# Patient Record
Sex: Female | Born: 1950 | Race: Black or African American | Hispanic: No | Marital: Single | Smoking: Current some day smoker
Health system: Southern US, Community
[De-identification: ages and names within clinical notes are randomized; demographics above are authoritative.]

## PROBLEM LIST (undated history)

## (undated) DIAGNOSIS — E559 Vitamin D deficiency, unspecified: Secondary | ICD-10-CM

## (undated) DIAGNOSIS — M069 Rheumatoid arthritis, unspecified: Secondary | ICD-10-CM

## (undated) DIAGNOSIS — T4145XA Adverse effect of unspecified anesthetic, initial encounter: Secondary | ICD-10-CM

## (undated) DIAGNOSIS — Z972 Presence of dental prosthetic device (complete) (partial): Secondary | ICD-10-CM

## (undated) DIAGNOSIS — T8859XA Other complications of anesthesia, initial encounter: Secondary | ICD-10-CM

## (undated) DIAGNOSIS — Z973 Presence of spectacles and contact lenses: Secondary | ICD-10-CM

## (undated) DIAGNOSIS — I10 Essential (primary) hypertension: Secondary | ICD-10-CM

## (undated) DIAGNOSIS — K219 Gastro-esophageal reflux disease without esophagitis: Secondary | ICD-10-CM

## (undated) DIAGNOSIS — M1990 Unspecified osteoarthritis, unspecified site: Secondary | ICD-10-CM

## (undated) HISTORY — PX: EYE SURGERY: SHX253

## (undated) HISTORY — DX: Rheumatoid arthritis, unspecified: M06.9

## (undated) HISTORY — PX: ANKLE ARTHROPLASTY: SUR68

## (undated) HISTORY — PX: SHOULDER ARTHROSCOPY W/ ROTATOR CUFF REPAIR: SHX2400

## (undated) HISTORY — PX: TUBAL LIGATION: SHX77

## (undated) HISTORY — PX: CERVICAL FUSION: SHX112

## (undated) HISTORY — DX: Vitamin D deficiency, unspecified: E55.9

## (undated) HISTORY — PX: WRIST ARTHROPLASTY: SHX1088

## (undated) HISTORY — PX: ULNAR NERVE REPAIR: SHX2594

## (undated) HISTORY — PX: CARPAL TUNNEL RELEASE: SHX101

---

## 1997-11-12 ENCOUNTER — Other Ambulatory Visit: Admission: RE | Admit: 1997-11-12 | Discharge: 1997-11-12 | Payer: Self-pay | Admitting: Internal Medicine

## 1998-04-12 ENCOUNTER — Ambulatory Visit (HOSPITAL_COMMUNITY): Admission: RE | Admit: 1998-04-12 | Discharge: 1998-04-12 | Payer: Self-pay | Admitting: Neurosurgery

## 1998-04-12 ENCOUNTER — Encounter: Payer: Self-pay | Admitting: Neurosurgery

## 1998-05-16 ENCOUNTER — Ambulatory Visit (HOSPITAL_COMMUNITY): Admission: RE | Admit: 1998-05-16 | Discharge: 1998-05-16 | Payer: Self-pay

## 1998-06-20 ENCOUNTER — Ambulatory Visit (HOSPITAL_BASED_OUTPATIENT_CLINIC_OR_DEPARTMENT_OTHER): Admission: RE | Admit: 1998-06-20 | Discharge: 1998-06-20 | Payer: Self-pay | Admitting: Orthopedic Surgery

## 1999-05-14 ENCOUNTER — Ambulatory Visit (HOSPITAL_BASED_OUTPATIENT_CLINIC_OR_DEPARTMENT_OTHER): Admission: RE | Admit: 1999-05-14 | Discharge: 1999-05-14 | Payer: Self-pay | Admitting: Orthopedic Surgery

## 1999-05-22 ENCOUNTER — Encounter: Admission: RE | Admit: 1999-05-22 | Discharge: 1999-08-20 | Payer: Self-pay | Admitting: Orthopedic Surgery

## 1999-10-07 ENCOUNTER — Encounter: Admission: RE | Admit: 1999-10-07 | Discharge: 1999-10-23 | Payer: Self-pay | Admitting: Neurology

## 1999-12-05 ENCOUNTER — Other Ambulatory Visit: Admission: RE | Admit: 1999-12-05 | Discharge: 1999-12-05 | Payer: Self-pay | Admitting: *Deleted

## 2000-01-22 ENCOUNTER — Ambulatory Visit (HOSPITAL_BASED_OUTPATIENT_CLINIC_OR_DEPARTMENT_OTHER): Admission: RE | Admit: 2000-01-22 | Discharge: 2000-01-23 | Payer: Self-pay | Admitting: Orthopedic Surgery

## 2000-05-04 ENCOUNTER — Ambulatory Visit (HOSPITAL_BASED_OUTPATIENT_CLINIC_OR_DEPARTMENT_OTHER): Admission: RE | Admit: 2000-05-04 | Discharge: 2000-05-04 | Payer: Self-pay | Admitting: Orthopedic Surgery

## 2000-11-02 ENCOUNTER — Other Ambulatory Visit: Admission: RE | Admit: 2000-11-02 | Discharge: 2000-11-02 | Payer: Self-pay | Admitting: Family Medicine

## 2001-02-11 ENCOUNTER — Ambulatory Visit (HOSPITAL_COMMUNITY): Admission: RE | Admit: 2001-02-11 | Discharge: 2001-02-11 | Payer: Self-pay | Admitting: Family Medicine

## 2001-02-16 ENCOUNTER — Encounter: Payer: Self-pay | Admitting: Family Medicine

## 2001-02-16 ENCOUNTER — Ambulatory Visit (HOSPITAL_COMMUNITY): Admission: RE | Admit: 2001-02-16 | Discharge: 2001-02-16 | Payer: Self-pay | Admitting: Family Medicine

## 2001-03-09 ENCOUNTER — Encounter: Payer: Self-pay | Admitting: Family Medicine

## 2001-03-09 ENCOUNTER — Ambulatory Visit (HOSPITAL_COMMUNITY): Admission: RE | Admit: 2001-03-09 | Discharge: 2001-03-09 | Payer: Self-pay | Admitting: Family Medicine

## 2001-06-10 ENCOUNTER — Encounter: Payer: Self-pay | Admitting: Family Medicine

## 2001-06-10 ENCOUNTER — Ambulatory Visit (HOSPITAL_COMMUNITY): Admission: RE | Admit: 2001-06-10 | Discharge: 2001-06-10 | Payer: Self-pay | Admitting: Family Medicine

## 2001-06-29 ENCOUNTER — Ambulatory Visit (HOSPITAL_BASED_OUTPATIENT_CLINIC_OR_DEPARTMENT_OTHER): Admission: RE | Admit: 2001-06-29 | Discharge: 2001-06-30 | Payer: Self-pay | Admitting: Orthopedic Surgery

## 2001-07-29 ENCOUNTER — Encounter: Admission: RE | Admit: 2001-07-29 | Discharge: 2001-09-21 | Payer: Self-pay | Admitting: Orthopedic Surgery

## 2001-11-15 ENCOUNTER — Other Ambulatory Visit: Admission: RE | Admit: 2001-11-15 | Discharge: 2001-11-15 | Payer: Self-pay | Admitting: Family Medicine

## 2002-02-07 ENCOUNTER — Encounter: Payer: Self-pay | Admitting: Emergency Medicine

## 2002-02-07 ENCOUNTER — Inpatient Hospital Stay (HOSPITAL_COMMUNITY): Admission: EM | Admit: 2002-02-07 | Discharge: 2002-02-10 | Payer: Self-pay | Admitting: Emergency Medicine

## 2002-02-09 ENCOUNTER — Encounter: Payer: Self-pay | Admitting: *Deleted

## 2002-02-23 ENCOUNTER — Encounter: Admission: RE | Admit: 2002-02-23 | Discharge: 2002-02-23 | Payer: Self-pay | Admitting: Family Medicine

## 2002-03-23 ENCOUNTER — Ambulatory Visit (HOSPITAL_BASED_OUTPATIENT_CLINIC_OR_DEPARTMENT_OTHER): Admission: RE | Admit: 2002-03-23 | Discharge: 2002-03-23 | Payer: Self-pay | Admitting: Orthopedic Surgery

## 2002-03-29 ENCOUNTER — Encounter: Admission: RE | Admit: 2002-03-29 | Discharge: 2002-06-27 | Payer: Self-pay | Admitting: Orthopedic Surgery

## 2003-01-04 ENCOUNTER — Ambulatory Visit (HOSPITAL_COMMUNITY): Admission: RE | Admit: 2003-01-04 | Discharge: 2003-01-04 | Payer: Self-pay | Admitting: Orthopedic Surgery

## 2003-01-04 ENCOUNTER — Ambulatory Visit (HOSPITAL_BASED_OUTPATIENT_CLINIC_OR_DEPARTMENT_OTHER): Admission: RE | Admit: 2003-01-04 | Discharge: 2003-01-04 | Payer: Self-pay | Admitting: Orthopedic Surgery

## 2004-06-04 ENCOUNTER — Encounter (INDEPENDENT_AMBULATORY_CARE_PROVIDER_SITE_OTHER): Payer: Self-pay | Admitting: Specialist

## 2004-06-04 ENCOUNTER — Ambulatory Visit (HOSPITAL_COMMUNITY): Admission: RE | Admit: 2004-06-04 | Discharge: 2004-06-05 | Payer: Self-pay | Admitting: Orthopedic Surgery

## 2004-12-19 ENCOUNTER — Emergency Department (HOSPITAL_COMMUNITY): Admission: EM | Admit: 2004-12-19 | Discharge: 2004-12-20 | Payer: Self-pay | Admitting: Emergency Medicine

## 2005-11-27 ENCOUNTER — Ambulatory Visit (HOSPITAL_BASED_OUTPATIENT_CLINIC_OR_DEPARTMENT_OTHER): Admission: RE | Admit: 2005-11-27 | Discharge: 2005-11-27 | Payer: Self-pay | Admitting: Orthopedic Surgery

## 2006-11-11 ENCOUNTER — Ambulatory Visit: Payer: Self-pay | Admitting: Family Medicine

## 2006-11-11 ENCOUNTER — Encounter (INDEPENDENT_AMBULATORY_CARE_PROVIDER_SITE_OTHER): Payer: Self-pay | Admitting: Nurse Practitioner

## 2006-11-11 LAB — CONVERTED CEMR LAB
ALT: 13 units/L (ref 0–35)
Albumin: 4.1 g/dL (ref 3.5–5.2)
CO2: 21 meq/L (ref 19–32)
Calcium: 9.3 mg/dL (ref 8.4–10.5)
Eosinophils Relative: 0 % (ref 0–5)
Glucose, Bld: 114 mg/dL — ABNORMAL HIGH (ref 70–99)
HCT: 40 % (ref 36.0–46.0)
Hemoglobin: 12.9 g/dL (ref 12.0–15.0)
Lymphocytes Relative: 18 % (ref 12–46)
Lymphs Abs: 2.4 10*3/uL (ref 0.7–3.3)
Monocytes Absolute: 0.4 10*3/uL (ref 0.2–0.7)
Platelets: 445 10*3/uL — ABNORMAL HIGH (ref 150–400)
RBC: 5.37 M/uL — ABNORMAL HIGH (ref 3.87–5.11)
Sodium: 142 meq/L (ref 135–145)
TSH: 0.296 microintl units/mL — ABNORMAL LOW (ref 0.350–5.50)
Total Bilirubin: 0.5 mg/dL (ref 0.3–1.2)
Total Protein: 6.7 g/dL (ref 6.0–8.3)
WBC: 13.9 10*3/uL — ABNORMAL HIGH (ref 4.0–10.5)

## 2006-11-17 ENCOUNTER — Emergency Department (HOSPITAL_COMMUNITY): Admission: EM | Admit: 2006-11-17 | Discharge: 2006-11-18 | Payer: Self-pay | Admitting: Emergency Medicine

## 2006-12-01 ENCOUNTER — Encounter (INDEPENDENT_AMBULATORY_CARE_PROVIDER_SITE_OTHER): Payer: Self-pay | Admitting: Nurse Practitioner

## 2006-12-01 ENCOUNTER — Ambulatory Visit: Payer: Self-pay | Admitting: Internal Medicine

## 2006-12-01 ENCOUNTER — Other Ambulatory Visit: Admission: RE | Admit: 2006-12-01 | Discharge: 2006-12-01 | Payer: Self-pay | Admitting: Family Medicine

## 2006-12-01 LAB — CONVERTED CEMR LAB
Cholesterol: 200 mg/dL (ref 0–200)
HDL: 50 mg/dL (ref >39)
LDL Cholesterol: 118 mg/dL — ABNORMAL HIGH (ref 0–99)
T3 Uptake Ratio: 39.8 % — ABNORMAL HIGH (ref 22.5–37.0)
Total CHOL/HDL Ratio: 4
Triglycerides: 159 mg/dL — ABNORMAL HIGH (ref <150)
VLDL: 32 mg/dL (ref 0–40)

## 2007-02-03 ENCOUNTER — Ambulatory Visit: Payer: Self-pay | Admitting: Internal Medicine

## 2007-02-03 ENCOUNTER — Encounter (INDEPENDENT_AMBULATORY_CARE_PROVIDER_SITE_OTHER): Payer: Self-pay | Admitting: Nurse Practitioner

## 2007-02-03 LAB — CONVERTED CEMR LAB
Free Thyroxine Index: 1.8 (ref 1.0–3.9)
T3 Uptake Ratio: 38.5 % — ABNORMAL HIGH (ref 22.5–37.0)
TSH: 3.17 microintl units/mL (ref 0.350–5.50)

## 2007-11-24 ENCOUNTER — Ambulatory Visit: Payer: Self-pay | Admitting: Family Medicine

## 2007-11-24 LAB — CONVERTED CEMR LAB
BUN: 20 mg/dL (ref 6–23)
CO2: 22 meq/L (ref 19–32)
Chloride: 109 meq/L (ref 96–112)
Creatinine, Ser: 0.82 mg/dL (ref 0.40–1.20)
Glucose, Bld: 79 mg/dL (ref 70–99)
HCT: 41.5 % (ref 36.0–46.0)
Hemoglobin: 13.8 g/dL (ref 12.0–15.0)
Lymphocytes Relative: 32 % (ref 12–46)
Lymphs Abs: 4.8 10*3/uL — ABNORMAL HIGH (ref 0.7–4.0)
Monocytes Absolute: 1 10*3/uL (ref 0.1–1.0)
Monocytes Relative: 6 % (ref 3–12)
Neutro Abs: 8.8 10*3/uL — ABNORMAL HIGH (ref 1.7–7.7)
WBC: 14.9 10*3/uL — ABNORMAL HIGH (ref 4.0–10.5)

## 2007-11-26 ENCOUNTER — Encounter: Admission: RE | Admit: 2007-11-26 | Discharge: 2007-11-26 | Payer: Self-pay

## 2008-01-20 ENCOUNTER — Inpatient Hospital Stay (HOSPITAL_COMMUNITY): Admission: RE | Admit: 2008-01-20 | Discharge: 2008-01-21 | Payer: Self-pay | Admitting: Neurosurgery

## 2008-02-21 ENCOUNTER — Encounter: Admission: RE | Admit: 2008-02-21 | Discharge: 2008-02-21 | Payer: Self-pay | Admitting: Neurosurgery

## 2008-04-03 ENCOUNTER — Encounter: Admission: RE | Admit: 2008-04-03 | Discharge: 2008-04-03 | Payer: Self-pay | Admitting: Neurosurgery

## 2008-05-03 ENCOUNTER — Encounter: Admission: RE | Admit: 2008-05-03 | Discharge: 2008-05-03 | Payer: Self-pay | Admitting: Neurosurgery

## 2008-11-22 ENCOUNTER — Other Ambulatory Visit: Admission: RE | Admit: 2008-11-22 | Discharge: 2008-11-22 | Payer: Self-pay | Admitting: Internal Medicine

## 2008-11-22 ENCOUNTER — Ambulatory Visit: Payer: Self-pay | Admitting: Internal Medicine

## 2008-11-22 ENCOUNTER — Encounter (INDEPENDENT_AMBULATORY_CARE_PROVIDER_SITE_OTHER): Payer: Self-pay | Admitting: Internal Medicine

## 2008-11-22 LAB — CONVERTED CEMR LAB
BUN: 20 mg/dL (ref 6–23)
CO2: 24 meq/L (ref 19–32)
Chloride: 107 meq/L (ref 96–112)
Creatinine, Ser: 0.79 mg/dL (ref 0.40–1.20)
Eosinophils Absolute: 0.2 10*3/uL (ref 0.0–0.7)
Eosinophils Relative: 1 % (ref 0–5)
Free T4: 0.72 ng/dL — ABNORMAL LOW (ref 0.80–1.80)
Glucose, Bld: 63 mg/dL — ABNORMAL LOW (ref 70–99)
HCT: 41.8 % (ref 36.0–46.0)
Hemoglobin: 12.9 g/dL (ref 12.0–15.0)
Lymphocytes Relative: 26 % (ref 12–46)
Lymphs Abs: 4.4 10*3/uL — ABNORMAL HIGH (ref 0.7–4.0)
MCV: 78.3 fL (ref 78.0–100.0)
Monocytes Absolute: 1 10*3/uL (ref 0.1–1.0)
Monocytes Relative: 6 % (ref 3–12)
Platelets: 404 10*3/uL — ABNORMAL HIGH (ref 150–400)
Potassium: 3.9 meq/L (ref 3.5–5.3)
TSH: 0.957 microintl units/mL (ref 0.350–4.500)
Total Bilirubin: 0.4 mg/dL (ref 0.3–1.2)
Total Protein: 6.9 g/dL (ref 6.0–8.3)
WBC: 16.7 10*3/uL — ABNORMAL HIGH (ref 4.0–10.5)

## 2008-11-29 ENCOUNTER — Ambulatory Visit: Payer: Self-pay | Admitting: Internal Medicine

## 2008-12-13 ENCOUNTER — Ambulatory Visit: Payer: Self-pay | Admitting: Internal Medicine

## 2009-01-17 ENCOUNTER — Ambulatory Visit: Payer: Self-pay | Admitting: Internal Medicine

## 2009-01-17 ENCOUNTER — Encounter (INDEPENDENT_AMBULATORY_CARE_PROVIDER_SITE_OTHER): Payer: Self-pay | Admitting: Internal Medicine

## 2009-01-17 LAB — CONVERTED CEMR LAB
Cholesterol: 208 mg/dL — ABNORMAL HIGH (ref 0–200)
HDL: 54 mg/dL (ref >39)
LDL Cholesterol: 117 mg/dL — ABNORMAL HIGH (ref 0–99)
Total CHOL/HDL Ratio: 3.9
Triglycerides: 184 mg/dL — ABNORMAL HIGH (ref <150)
VLDL: 37 mg/dL (ref 0–40)

## 2009-03-26 ENCOUNTER — Ambulatory Visit: Payer: Self-pay | Admitting: Family Medicine

## 2009-03-26 LAB — CONVERTED CEMR LAB
ALT: 13 units/L (ref 0–35)
AST: 20 units/L (ref 0–37)
Albumin: 4.1 g/dL (ref 3.5–5.2)
Cholesterol: 199 mg/dL (ref 0–200)
HDL: 60 mg/dL (ref >39)
Total CHOL/HDL Ratio: 3.3
Total Protein: 6.6 g/dL (ref 6.0–8.3)
Triglycerides: 166 mg/dL — ABNORMAL HIGH (ref <150)

## 2009-09-25 ENCOUNTER — Ambulatory Visit: Payer: Self-pay | Admitting: Internal Medicine

## 2009-10-23 ENCOUNTER — Ambulatory Visit: Payer: Self-pay | Admitting: Internal Medicine

## 2009-10-23 LAB — CONVERTED CEMR LAB
Calcium: 9.1 mg/dL (ref 8.4–10.5)
Glucose, Bld: 80 mg/dL (ref 70–99)
Sodium: 142 meq/L (ref 135–145)

## 2009-11-28 ENCOUNTER — Ambulatory Visit: Payer: Self-pay | Admitting: Internal Medicine

## 2009-11-28 ENCOUNTER — Other Ambulatory Visit: Admission: RE | Admit: 2009-11-28 | Discharge: 2009-11-28 | Payer: Self-pay | Admitting: Internal Medicine

## 2010-03-12 ENCOUNTER — Telehealth (INDEPENDENT_AMBULATORY_CARE_PROVIDER_SITE_OTHER): Payer: Self-pay | Admitting: *Deleted

## 2010-03-13 ENCOUNTER — Ambulatory Visit: Payer: Self-pay

## 2010-03-13 ENCOUNTER — Encounter (HOSPITAL_COMMUNITY): Admission: RE | Admit: 2010-03-13 | Payer: Self-pay | Source: Home / Self Care | Admitting: Internal Medicine

## 2010-03-13 ENCOUNTER — Ambulatory Visit (HOSPITAL_COMMUNITY)
Admission: RE | Admit: 2010-03-13 | Discharge: 2010-03-13 | Payer: Self-pay | Source: Home / Self Care | Attending: Internal Medicine | Admitting: Internal Medicine

## 2010-03-13 ENCOUNTER — Encounter: Payer: Self-pay | Admitting: Cardiology

## 2010-04-14 ENCOUNTER — Ambulatory Visit (HOSPITAL_COMMUNITY)
Admission: RE | Admit: 2010-04-14 | Discharge: 2010-04-14 | Payer: Self-pay | Source: Home / Self Care | Attending: Family Medicine | Admitting: Family Medicine

## 2010-05-08 NOTE — Progress Notes (Signed)
Summary: Dobuatamine ECHO Pre-Procedure  Phone Note Outgoing Call   Call placed by: Milana Na, EMT-P,  March 12, 2010 3:44 PM Summary of Call: Reviewed information on [employer] Information Sheet (see scanned document for further details).  Spoke with patient. Dobutamine ECHO instructions given. Initial call taken by: Milana Na, EMT-P,  March 12, 2010 3:45 PM

## 2010-05-16 ENCOUNTER — Ambulatory Visit
Admission: RE | Admit: 2010-05-16 | Discharge: 2010-05-16 | Disposition: A | Discharge status: Home / Self Care | Payer: Medicare Other | Source: Ambulatory Visit | Attending: *Deleted | Admitting: *Deleted

## 2010-05-16 ENCOUNTER — Other Ambulatory Visit: Payer: Self-pay

## 2010-05-16 ENCOUNTER — Other Ambulatory Visit: Payer: Self-pay | Admitting: *Deleted

## 2010-05-16 DIAGNOSIS — M542 Cervicalgia: Secondary | ICD-10-CM

## 2010-07-29 ENCOUNTER — Ambulatory Visit (HOSPITAL_BASED_OUTPATIENT_CLINIC_OR_DEPARTMENT_OTHER): Payer: Medicare Other | Attending: Family Medicine

## 2010-07-29 DIAGNOSIS — G4730 Sleep apnea, unspecified: Secondary | ICD-10-CM | POA: Insufficient documentation

## 2010-07-29 DIAGNOSIS — G4710 Hypersomnia, unspecified: Secondary | ICD-10-CM | POA: Insufficient documentation

## 2010-08-02 DIAGNOSIS — R0609 Other forms of dyspnea: Secondary | ICD-10-CM

## 2010-08-02 DIAGNOSIS — G4730 Sleep apnea, unspecified: Secondary | ICD-10-CM

## 2010-08-02 DIAGNOSIS — G4733 Obstructive sleep apnea (adult) (pediatric): Secondary | ICD-10-CM

## 2010-08-02 DIAGNOSIS — R0989 Other specified symptoms and signs involving the circulatory and respiratory systems: Secondary | ICD-10-CM

## 2010-08-02 DIAGNOSIS — G4710 Hypersomnia, unspecified: Secondary | ICD-10-CM

## 2010-08-02 NOTE — Procedures (Signed)
[name], Jenna Medina              ACCOUNT NO.:  [id number]  MEDICAL RECORD NO.:  [id number]          PATIENT TYPE:  OUT  LOCATION:  SLEEP CENTER                 FACILITY:  [facility]  PHYSICIAN:  Sherilyn Windhorst D. Maple Hudson, MD, FCCP, FACPDATE OF BIRTH:  November 10, 1950  DATE OF STUDY:  07/29/2010                           NOCTURNAL POLYSOMNOGRAM  REFERRING PHYSICIAN:  AMELIA WILSON  INDICATION FOR STUDY:  Hypersomnia with sleep apnea.  EPWORTH SLEEPINESS SCORE:  7/24, BMI 26.5.  Weight 140 pounds, height 61 inches.  Neck 13.5 inches.  MEDICATIONS:  Home medications are charted and reviewed.  SLEEP ARCHITECTURE:  Total sleep time 410 minutes with sleep efficiency 96.8%.  Stage I was 3.9%, stage II 74.5%, stage III 0.4%, REM 21.2% of total sleep time.  Sleep latency 1.5 minutes, REM latency 102 minutes, awake after sleep onset 12 minutes, arousal index 8.  BEDTIME MEDICATION:  None.  RESPIRATORY DATA:  Apnea-hypopnea index (AHI) 3.8 per hour.  A total of 26 events was scored including 8 obstructive apneas, 5 central apneas, 13 hypopneas.  The events were not positional.  REM/AHI 14.5 per hour. RDI 5.7 per hour.  There were insufficient events to permit initiation of CPAP titration by split-study protocol on this study night.  OXYGEN DATA:  Moderately loud snoring with oxygen desaturation to a nadir of 86% and a mean oxygen saturation through the study of 94.5% on room air.  CARDIAC DATA:  Sinus rhythm with occasional PAC.  MOVEMENT-PARASOMNIA:  A few limb jerks were noted with insignificant effect on sleep.  No bathroom trips.  IMPRESSIONS-RECOMMENDATIONS: 1. Well-sustained sleep. 2. Occasional respiratory events with sleep disturbance, within normal     limits, AHI 3.8 per hour (normal range 0-     5 per hour).  Non positional events with moderately loud snoring     and oxygen desaturation to a nadir of 86% and mean saturation     through the study of 94.5% on room air.     Rosebud Koenen D.  Maple Hudson, MD, [facility], FACP Diplomate, Biomedical engineer of Sleep Medicine Electronically Signed    CDY/MEDQ  D:  08/02/2010 11:08:37  T:  08/02/2010 11:49:21  Job:  [id number]

## 2010-08-05 ENCOUNTER — Other Ambulatory Visit: Payer: Self-pay | Admitting: Pain Medicine

## 2010-08-05 ENCOUNTER — Other Ambulatory Visit: Payer: Self-pay | Admitting: Unknown Physician Specialty

## 2010-08-05 DIAGNOSIS — M542 Cervicalgia: Secondary | ICD-10-CM

## 2010-08-19 NOTE — Op Note (Signed)
Jenna Medina, Jenna Medina              ACCOUNT NO.:  [id number]   MEDICAL RECORD NO.:  [id number]          PATIENT TYPE:  OIB   LOCATION:  3172                         FACILITY:  [facility]   PHYSICIAN:  Donalee Citrin, M.D.        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   PROCEDURE:  Intracervical diskectomy and fusion at C3-4 using a 7-mm  allograft wedge and a 23-mm Venture plate with four 30-mm beveled  screws.   SURGEON:  Donalee Citrin, MD   ASSISTANT:  Tia Alert, MD   ANESTHESIA:  General endotracheal.   HISTORY OF PRESENT ILLNESS:  The patient is a very pleasant 60 year old  female who has had progressive worsening of neck pain, bilateral arm  numbness and tingling with weakness in her hand, and difficulty opening  jaws.  MRI scan showed a large spondylitic spurring disk displacing the  spinal cord and causing severe spinal stenosis at C3-4.  Due to the  patient's progression of clinical symptoms of early myelopathy and MRI  findings, the patient was recommended to cervical diskectomy fusion.  The risks and benefits of the operation were explained to the patient.  She understood and agreed to proceed forth.   The patient was brought to the OR, was induced general anesthesia,  positioned supine, neck flexed with 5 pounds halter traction.  The right  side of her neck was prepped and draped in usual sterile fashion.  Preoperative x-ray localized the appropriate level, so a curvilinear  incision was made just above the midline through the anterior border  superficial layer of the platysma was identified and divided  longitudinally.  The avascular plane between the sternocleidomastoid and  strap muscle was brought down to the prevertebral fascia.  Prevertebral  fascia was then dissected with Kitners.   Intraoperative x-ray confirmed  localization at the appropriate level.  Annulotomy was then made with a  15 blade scalpel mark.  The disk space in the longus colli was reflected  laterally and self-retaining retractors were placed.  Annulotomy was  then extended.  Anterior osteophytes were bitten off the C3 and C4  vertebral bodies with through-and-through Kerrison punch, and a high-  speed drill was used to drill down the disk space.  The disk space was  noted to be markedly spondylotic and collapsed with minimal disk  residual within the disk space itself, so a drill was used drill down  the spondylitic ridge off both endplates down the posterior annulus and  posterior osteophyte complex.  At this point, the operating microscope  was draped and brought into the field.  Under microscopic illumination,  the annulus surface of the posterior annulus and both C3 and C4  endplates were aggressively underbitten with 1- and 2-mm Kerrison punch  decompressing the central canal.  There was a very large spur causing  marked spinal cord compression predominantly on the left side but  bilaterally and severe uncinate hypertrophy.  This was all underbitten  aggressively.  The C4 pedicles were identified.  The C4 nerve roots and  neural foramen were identified and decompressed, and after aggressive  underbiting of both endplates, central canal was decompressed.  Then,  the endplates were scraped with upgoing curette.  A 7-mm graft was  inserted.  One 2-mm deep anterior __________ .  A 23-mm Venture plate  was then placed.  All screws had excellent purchase.  Locking mechanism  was engaged.  Meticulous hemostasis was maintained and the wound was  copiously irrigated and the  wound was closed in layers with 3-0 Vicryl in platysma and running a 4-0  subcuticular.  Benzoin and Steri-Strips were applied.  The patient  returned to the recovery room in stable condition.  At the end of the  case, sponge and  instrument count was correct.           ______________________________  Donalee Citrin, M.D.     GC/MEDQ  D:  01/20/2008  T:  01/20/2008  Job:  [id number]

## 2010-08-22 NOTE — Op Note (Signed)
Jenna Medina, Jenna Medina              ACCOUNT NO.:  [id number]   MEDICAL RECORD NO.:  [id number]          PATIENT TYPE:  OIB   LOCATION:  2550                         FACILITY:  [facility]   PHYSICIAN:  Cindee Salt, M.D.       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   POSTOPERATIVE DIAGNOSIS:  Status post total wrist arthroplasty with  loosening of the prosthesis, distal component.   OPERATION:  Revision of total wrist arthroplasty distal, component.   SURGEON:  Cindee Salt, M.D.   ASSISTANT:  Artist Pais. Mina Marble, M.D.   ANESTHESIA:  General.   HISTORY OF PRESENT ILLNESS:  The patient is a 60 year old female with  history of a uni-total wrist arthroplasty. She had suffered a fall and has  shown loosening of the distal component. She is admitted for revision.   PROCEDURE:  The patient was brought to the operating room where a general  anesthetic was carried out without difficulty. She was prepped using  DuraPrep, supine position, left arm free. The limb was exsanguinated with an  Esmarch bandage, tourniquet placed high on the arm and was inflated to 150  mmHg. The old incision was used, carried down through subcutaneous tissue.  Bleeders were electrocauterized. Dissection carried down to the third,  fourth dorsal compartment.  Tendons were protected.  A T was made distally.  The joint was then opened. Cultures were taken for both aerobic and  anaerobic cultures. Significant synovitis was present with a dark, black  appearance.  This was removed and sent to pathology.  The distal component  was noted be extremely loose. A drill hole was then placed into the  polyethylene, a screw placed, and the polyethylene cap removed without  difficulty. The two retaining screws were removed. The  plate distally was  then removed without difficulty. The volar synovectomy was performed. The  screw holes and peg hole was then cleaned with rongeurs and curets.  Matrix  was then placed after copiously irrigating each of the component holes after  lining these and marking with radiographs to be certain that the new screw  holes would go down the shaft of the little finger and the shaft of the  index finger. This was confirmed prepped.  Grafton was placed into each.  Again, the alignment was confirmed with the image intensifier and the distal  component was then placed and tapped home.  A 30 mm screw was then placed  into the ulnar side of the distal plate and fixed.  X-rays confirmed  positioning.  Position was confirmed prior to placement of the screw with a  guide wire.  The radial screw was then placed into the index.  This was a 35  mm screw. The x-rays confirmed positioning.  The plate was then tapped to  fully seat it and the screws retightened.  Excellent fixation was afforded.  The polyethylene component was then placed. This  was then tapped into  position confirming alignment, AP and lateral x-rays.  The wrist was placed  through a full range motion with a full range able to be obtained.  X-rays  confirmed positioning of the prosthesis proximally and distally. The wound  was copiously irrigated with saline. The capsule was then closed with figure-  of-eight 4-0 Mersilene sutures, the retinaculum repaired with 4-0 Vicryl,  the subcutaneous tissue with 4-0 Vicryl, and skin with interrupted 5-0 nylon  sutures. Sterile compressive dorsal palmar  splint applied.  On deflation of the tourniquet, all fingers immediately  pinked. She was taken to the recovery room for observation in satisfactory  condition.  She will be discharged on Percocet and a Medrol Dosepak.  She  was given vancomycin preoperatively and Solu-Medrol preoperatively.      GK/MEDQ  D:  06/04/2004  T:  06/04/2004   Job:  [id number]   cc:   Cindee Salt, M.D.  [address]  [place]  [place] [address]  Fax: [phone]

## 2010-08-22 NOTE — Op Note (Signed)
[facility]  Patient:    Jenna Medina, Jenna Medina                       MRN: [id number] Proc. Date: 05/04/00 Adm. Date:  [id number] Attending:  Ronne Binning                           Operative Report  PREOPERATIVE DIAGNOSIS:  Failed fusion right wrist, status post total wrist arthroplasty for rheumatoid arthritis.  SURGEON:  Nicki Reaper, M.D.  ASSISTANT:  Joaquin Courts, R.N.  ANESTHESIA:  General.  ANESTHESIOLOGIST:  Bedelia Person, M.D.  HISTORY:  The patient is a 60 year old female with a history of rheumatoid arthritis.  She has undergone a total wrist arthroplasty, which unfortunately cut out.  She underwent fusion of her wrist with iliac crest bone graft, which healed proximally, did not heal distally.  She is admitted now for fixation, fusion, bone grafting to the nonunion site, due to pain.  DESCRIPTION OF PROCEDURE:  The patient was brought to the operating room, where a general anesthetic was carried out without difficulty.  She was prepped and draped using Betadine scrub and solution with the right arm free. The limb was exsanguinated with an Esmarch bandage, the tourniquet placed high in the arm, was inflated to 200 mmHg.  The old incision was used, extended distally, carried down through subcutaneous tissue.  Bleeders were electrocauterized.  The dissection carried down on the radial aspect to the extensor fourth dorsal compartment tendon.  The periosteum opened.  The nonunion site was immediately apparent.  This was removed with rongeurs and curettes.  This was a sizeable defect.  Its position was confirmed with OEC x-rays.  The entire area of nonunion was removed.  The broken pin was identified but found to be buried within the bone, and it was decided not to attempt to retrieve this in that it was not causing any significant problem and would require significant destruction of the graft.  The allograft femoral head was then cut in half.   The defect was measured.  A 0.5 cm wedge-type graft was then selected.  It was curved in nature.  This was cut out of the femoral head and measured again.  The depth was confirmed.  The graft was then shaped.  This was then positioned into the defect, which brought the wrist out another 0.75-1 cm in distance.  An undercut on the metacarpal bases was then filled with further bone graft, cancellous in nature, to support the corticocancellous graft of the femoral head, which was placed.  This was packed in.  X-rays confirmed position.  The wound irrigated.  Two 6-2 K-wires were then passed from the second and third metacarpals through the graft into the distal radius, securely positioning it.  This was done with forcible compression.  X-rays taken in the AP, lateral, and oblique directions revealed good positioning of the pin, good positioning of the graft.  The wound was again irrigated.  The periosteum closed with interrupted 4-0 Vicryl suture. The subcutaneous tissue was closed with interrupted 4-0 Vicryl and the skin with a running 5-0 nylon suture.  A sterile compressive dressing and splint was applied.  This was a dorsal palmar splint.  The patient tolerated the procedure well and was taken to the recovery room for observation in satisfactory condition.  She is discharged home, to return to the [facility] [place] in one week,  on Percocet and Keflex. DD:  05/04/00 TD:  05/04/00 Job: [id number]

## 2010-08-22 NOTE — Discharge Summary (Signed)
Jenna Medina, Jenna Medina                          ACCOUNT NO.:  [id number]   MEDICAL RECORD NO.:  [id number]                   PATIENT TYPE:  INP   LOCATION:  3709                                 FACILITY:  [facility]   PHYSICIAN:  Nani Gasser, M.D.            DATE OF BIRTH:  01/01/1951   DATE OF ADMISSION:  02/07/2002  DATE OF DISCHARGE:  02/10/2002                                 DISCHARGE SUMMARY   DIAGNOSES:  1. Chest pain.  2. Tobacco abuse.  3. Rheumatoid arthritis.   DISCHARGE MEDICATIONS:  1. Prednisone 5 mg two tablets p.o. every day.  2. Ridaura 3 mg one p.o. b.i.d.  3. Forteo 3 ml, inject 3 ml once a day.  4. Aspirin 325 mg one p.o. every day.  5. Naproxen 500 mg one p.o. b.i.d.  6. Protonix 40 mg one p.o. every day.  7. Vicodin 5/500 one q.6h. p.r.n. for pain.  8. Nitroglycerin 0.4 mg one tablet under tongue p.r.n. for chest pain.  Can     repeat q.5 minutes x3 if needed.  The patient is to call 911 after three     tablets.  The patient was given special instructions to call her doctor     or seek medical assistance if she continued to have any chest pain,     shortness of breath, or otherwise ill.   HOSPITAL COURSE:  The patient is a 60 year old African American female with  a three-hour history of sharp, stabbing, intersternal chest pain.  The  patient had an EKG showing normal sinus rhythm with no ST or T-wave changes.  It also had flipped T waves in V1 and V2, and mild bradycardia.  The  patient's chest pain was relieved with nitroglycerin.  She also had risk  factors including tobacco abuse but denied any nausea, vomiting, diarrhea,  shortness of breath, or pleuritic pain.  The patient's cardiac enzymes  showed a bump from troponin of 0.1 to 0.2 with a CK from 92 to 96, and MB of  2.9 to 3.0.   The following concerns were addressed during admission.  1. Atypical chest pain.  The patient would rule out for MI.  She also had an     adenosine Cardiolite study  on the February 09, 2002 showing that the     patient did have mild chest pain resolving with rest.  There was also a     2:1 and 3:1 block with the adenosine, and a few PACs.  Report was called     to nursing showing that the Cardiolite did not show any ischemia, did     show some decreased activity in the distal anterior wall, but most likely     secondary to breast attenuation.  There was no definite infarct or     ischemia seen, and left ventricular ejection fraction was estimated to be     in 81%, with probable left  ventricular hypertrophy.  Thus, the patient     was discharged home to follow up with her primary care physician at     [facility].  2. Rheumatoid arthritis.  The patient did complain of her joints bothering     her during admission.  The patient was given Vicodin and restarted on her     home medications, including Forteo and Ridaura, as well as her prednisone     and naproxen.  3. Osteoporosis.  The patient was continued on her Forteo during admission.                                              Nani Gasser, M.D.   CM/MEDQ  D:  02/09/2002  T:  02/11/2002  Job:  [id number]

## 2010-08-22 NOTE — Op Note (Signed)
NAMECHAMPAGNE, PALETTA                          ACCOUNT NO.:  [id number]   MEDICAL RECORD NO.:  [id number]                   PATIENT TYPE:  AMB   LOCATION:  DSC                                  FACILITY:  [facility]   PHYSICIAN:  Cindee Salt, M.D.                    DATE OF BIRTH:  04-24-1950   DATE OF PROCEDURE:  01/04/2003  DATE OF DISCHARGE:                                 OPERATIVE REPORT   PREOPERATIVE DIAGNOSIS:  Carpal tunnel syndrome, left hand.   POSTOPERATIVE DIAGNOSIS:  Carpal tunnel syndrome, left hand.   OPERATION:  Decompression of left median nerve.   SURGEON:  Cindee Salt, M.D.   ASSISTANTCarolyne Fiscal.   ANESTHESIA:  General.   INDICATIONS FOR PROCEDURE:  The patient is a 60 year old female with a  history of carpal tunnel syndrome and rheumatoid arthritis.  This has not  responded to conservative treatment.  She is admitted for a carpal tunnel  release and possible tenosynovectomy of flexor tendons, left hand.   DESCRIPTION OF PROCEDURE:  The patient was brought to the operating room and  a general anesthetic carried out without difficulty.  She was prepped using  DuraPrep in the supine position with the left arm free.  The limb was  exsanguinated with an Esmarch bandage.  The tourniquet placed high on the  arm was inflated to 250 mmHg.  A longitudinal incision was made in the palm  and carried down through the subcutaneous tissue.  Bleeders were  electrocauterized.  The palmar fascia was split.  The superficial palmar  arch was identified.  The flexor tendon to the ring and the little finger  was identified to the ulnar side of the median nerve.  The carpal  retinaculum was incised with sharp dissection.  A right angle and Sewall  retractor were placed between the skin and forearm fascia.  The fascia was  released beneath the skin for approximately 3 cm.  The canal was explored.  The tenosynovial tissue was not thickened.  It did not have a significant  rheumatoid  appearance to it.  It did not invade the flexor tendons, and as  such it was decided not to proceed with a tenosynovectomy.  The wound was  irrigated.  The skin was then closed with interrupted #5-0 nylon sutures.  A  sterile compressive dressing and splint were applied.  The patient tolerated the procedure well and was taken to the recovery room  for observation, in satisfactory condition.   DISPOSITION:  She is discharged home, to return to the [facility] in one week, on Vicodin and Keflex.                                               Jillyn Hidden  Merlyn Lot, M.D.    Angelique Blonder  D:  01/04/2003  T:  01/04/2003  Job:  [id number]

## 2010-08-22 NOTE — Op Note (Signed)
NAMEELISAVET, Jenna Medina                          ACCOUNT NO.:  [id number]   MEDICAL RECORD NO.:  [id number]                   PATIENT TYPE:  AMB   LOCATION:  DSC                                  FACILITY:  [facility]   PHYSICIAN:  Cindee Salt, M.D.                    DATE OF BIRTH:  26-Jul-1950   DATE OF PROCEDURE:  DATE OF DISCHARGE:                                 OPERATIVE REPORT   PREOPERATIVE DIAGNOSIS:  Tardy ulnar nerve palsy, left arm.   POSTOPERATIVE DIAGNOSIS:  Tardy ulnar nerve palsy, left arm.   OPERATION:  Subcutaneous transposition, left ulnar nerve.   SURGEON:  Dr. Merlyn Lot.   ANESTHESIA:  General.   DATE OF OPERATION:  03/23/02.   HISTORY:  This patient is a 60 year old female with a history of rheumatoid  arthritis.  She has developed an ulnar neuropathy at her left elbow.  EMG  nerve conduction is positive which has not responded to conservative  treatment.   DESCRIPTION OF PROCEDURE:  The patient is brought to the operating room  where a general anesthetic was carried out without difficulty.  She was  prepped and draped using Betadine scrub and solution, left arm free, supine  position.  The limb was exsanguinated with an Esmarch bandage, tourniquet  placed high and the arm was inflated to 250 mmHg.  A straight incision was  made over the medial epicondyle, carried down through subcutaneous tissue  and bleeders were electrocautery.  The medial right cutaneous nerve form was  identified and protected.  The ulnar nerve was identified, this was  released.  The medial intermuscular septum was incised, left attached to the  tip of the epicondyle.  A fasciotomy was performed to the flexor carpi  ulnaris.  The arcade of Struthers was released.  The nerve was then  anteriorly transposed, carrying with it the vascular structures.  Origin of  the flexor carpi ulnaris was then left attached distally.  A sling was  created and sutured into position to the fascia with  figure-of-eight 4-0  Vicryl sutures.  Using both the medial intermuscular septum and the portion  of fascia over flexor carpi ulnaris.  The nerve freely moved, the wound was  irrigated and subcutaneous tissue was closed with interrupted 3-0 Vicryl  sutures.  The skin was closed with subcuticular 4-0 Monocryl suture.  Steri-  Strips were applied.  A sterile compressive dressing, long arm splint was  applied.  The patient tolerated the procedure well and was taken to the  recovery room for observation, in satisfactory condition.  She is discharged  home to return to the [facility] [place] in one week on Vicodin,  Keflex and Medrol Dosepak.  Cindee Salt, M.D.    GK/MEDQ  D:  03/23/2002  T:  03/24/2002  Job:  [id number]

## 2010-08-22 NOTE — Op Note (Signed)
[place]  Patient:    Jenna Medina, Jenna Medina                       MRN: [id number] Proc. Date: 05/14/99 Adm. Date:  [id number] Disc. Date: [id number] Attending:  Ronne Binning CC:         Nicki Reaper, M.D. (2)                           Operative Report  PREOPERATIVE DIAGNOSIS:  Rheumatoid arthritis, status post wrist fusion, status  post total wrist arthroplasty with loosening, right wrist.  POSTOPERATIVE DIAGNOSIS:  Rheumatoid arthritis, status post wrist fusion, status post total wrist arthroplasty with loosening, right wrist.  OPERATION:  Fusion carpometacarpal joint, index and middle finger, right hand.  SURGEON:  Nicki Reaper, M.D.  ASSISTANT:  Joaquin Courts, R.N.  ANESTHESIA:  General.  ANESTHESIOLOGIST:  Janetta Hora. Gelene Mink, M.D.  HISTORY:  The patient is a 60 year old female with a history of rheumatoid arthritis.  She underwent total wrist arthroplasty, however, the prosthesis loosened distally and cut out necessitating fusion.  She has had continued pain at the carpometacarpal joint of the index and middle finger, relieved by injection. She is brought in now for fusion of those two joints.  DESCRIPTION OF PROCEDURE:  The patient was brought to the operating room where  general anesthesia was carried out without difficulty.  She was prepped and draped using Betadine scrub and solution, with the right arm free.  The limb was exsanguinated with an Esmarch bandage.  Tourniquet placed high on the arm was inflated to 250 mmHg.  The incision was made using the old incision, carried down through subcutaneous tissue.  Bleeders were electrocauterized.  The dissection carried down to the extensor insertion of the sensor carpi radialis longus and brevis.  The periosteum was elevated.  The joint was identified.  This was then  removed with a rongeur curet allowing the metacarpal to be pulled up into more dorsiflexion.  Two 4-5 K-wires  were then placed into the metacarpal of the index, metacarpal of the middle.  Bone putty was then placed into the defect created by the resection of the joint and the two K-wires were then passed across into the  capitate securing the metacarpals into position.  These were confirmed on AP and lateral x-rays.  The wound was irrigated.  The periosteum closed with figure-of-eight 4-0 Vicryl.  The subcutaneous tissue closed with interrupted 4-0 Vicryl and the skin with a subcuticular 4-0 Monocryl suture.  Steri-Strips were  applied.  Sterile compressive dressing and splint applied.  The patient tolerated the procedure well and was taken to the recovery room for  observation in satisfactory condition  She is discharged home to return to [facility] [place] in one week, n Vicodin and Keflex. DD:  05/14/99 TD:  05/14/99 Job: [id number]

## 2010-08-22 NOTE — Op Note (Signed)
Jenna Medina, Jenna Medina              ACCOUNT NO.:  [id number]   MEDICAL RECORD NO.:  [id number]          PATIENT TYPE:  AMB   LOCATION:  DSC                          FACILITY:  [facility]   PHYSICIAN:  Cindee Salt, M.D.       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   OPERATION:  Artelon prosthetic replacement [facility] right middle finger.   SURGEON:  Cindee Salt, M.D.   ASSISTANT:  Carolyne Fiscal R.N.   ANESTHESIA:  General.   HISTORY:  The patient is a 60 year old female with a history of rheumatoid  arthritis.  She underwent total joint replacement, however, has had nonunion  or failure of the prosthesis.  This was removed.  A fusion was attempted.  The proximal fusion occurred, the distal fusion did not.  She is admitted  for interposition arthroplasty to the [facility] joint of her right middle finger,  possibly index. In the preoperative area questions were answered.  She is  aware of the potential for failure of the prosthetic replacement, failure of  total relief of pain, but is desirous of proceeding to have this done, the  possibility of infection, injury to artery, nerves, tendons, dystrophy.   The patient's extremity was marked by both the patient and surgeon,  questions encouraged and answered.   PROCEDURE:  The patient was brought to the operating room where a general  anesthetic was carried out without difficulty.  She was prepped using  DuraPrep, supine position, right arm free.  The limb was exsanguinated with  an  Esmarch bandage, tourniquet placed high on the arm was inflated to 250  mmHg.  A straight incision was made over the dorsal aspect where motion  occurred to her wrist, right side through the old incision carried down  through subcutaneous tissue.  Bleeders were electrocauterized.  The  pseudoarthrosis was identified distally.  The joint was opened granulation  tissue, fibrous tissue was removed with a rongeur.  An oscillating saw was  then used to remove the distal aspect of the graft up for the wrist fusion.  An oscillating saw was then used to remove the dorsal cortices of the graft  and of the middle finger metacarpal for placement of a 1.5 mm Artelon  prosthesis.  The prosthesis was soaked.  The index carpometacarpal area did  not appear to have significant articulation, and as such it was decided not  to proceed with a prosthetic replacement there.  The prosthesis was soaked,  the area irrigated.  The prosthesis placed, was found to be long, this was  cut short to match the depth of the carpometacarpal area.  This was then  affixed with two  screws, one 18 and one 16 2 mm screw proximally and  distally.  The wound was again irrigated.  The periosteum capsule closed  with figure-of-eight 4-0 Vicryl sutures.  Subcutaneous tissue with 4-0  Vicryl and skin with subcuticular 3-0 Monocryl sutures.  Steri-Strips were  applied.  Sterile compressive dressing and dorsal palmar splint applied.  The prosthesis was very stable.  X-rays revealed it good position with screw  lengths engaging the  volar cortices.  The patient tolerated the procedure well and was taken to  the recovery room for observation in satisfactory condition.  She will be  discharged home to return to the [facility] [place] in 1 week on  Percocet.           ______________________________  Cindee Salt, M.D.     GK/MEDQ  D:  11/27/2005  T:  11/27/2005  Job:  [id number]

## 2010-08-22 NOTE — Op Note (Signed)
[place]  Patient:    Jenna Medina, FESS Visit Number: [id number] MRN: [id number]          Service Type: DSU Location: [facility] Attending Physician:  Ronne Binning Dictated by:   Nicki Reaper, M.D. Proc. 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  POSTOPERATIVE DIAGNOSIS:  Rheumatoid arthritis with destruction of left wrist.  OPERATION:  Total wrist arthroplasty left wrist.  SURGEON:  Nicki Reaper, M.D.  ASSISTANT:  Artist Pais. Mina Marble, M.D.  ANESTHESIA:  General.  INDICATION:  The patient is a 60 year old female with a history of rheumatoid arthritis.  She has had rib pain not responding to conservative treatment.  DESCRIPTION OF PROCEDURE:  The patient was brought to the operating room where a general anesthetic was carried out without difficulty.  She was prepped and draped using Betadine scrub and solution and the left arm free.  The limb was exsanguinated with an Esmarch bandage.  Tourniquet was placed high on the arm and was inflated to 250 mmHg.  A longitudinal incision was made over the wrist and hand on to the distal forearm using the old incision, carried down through subcutaneous tissue.  Bleeders were electrocauterized.  Neurovascular structures protected.  The extensor retinaculum was released from the radial side, preserved to the ulnar side over all six compartments.  The extensor tendons were then pulled aside.  A large flap was then created on the dorsal capsule leaving it based distally, lifting it from the radius proximally.  This was preserved.  An incision was made in the distal radial ulnar joint.  The distal ulna showed marked erosions.  Loose bodies were removed.  Synovectomy performed and the ulnar head was removed using an oscillating saw.  This was then dissected  free from soft tissue leaving these attached.  The wrist joint was then opened.  The distal radius showed total loss of cartilage.  The Listers tubercle was removed.  An awl was used to make a hole approximately 5 mm volar to the tubercle of Lister.  This was then enlarged and a guide pin placed for the application of the template for the distal radius cut.  The position of the guide pin was confirmed on x-ray.  Found to go down the shaft of the radius.  The guide pin was placed.  A two 4.5 K-wires were then inserted.  A preliminary cut was made in the distal radius.  The guide pin was removed.  Then the volar cut completed removing the distal radius articular surface.  The broach was then used to enlarge this hole for the placement of a medium prosthesis.  The proximal carpal row was removed leaving the distal half of the scaphoid, the distal half of the triquetrium in place.  The guide was then placed for the placement of the guide distally along the shaft of the third metacarpal.  The distal hamate was identified and the guide placed on this.  A drill hole made with a 2.5 mm drill.  It was slightly enlarged to 3.5.  This allowed placement of the template distally.  The guide was placed on the index finger and a drill hole was then made for the placement of the radial screw into the shaft  of the metacarpal base of the index finger.  This was done with a 4.5 drill and 4.5 screw.  Placement was evaluated and under image intensification lying in good position with the stem in the third metacarpal and the screw in the second metacarpal.  The screw to the ulnar side was then placed.  This was 20 mm.  This was placed and confirmed to lie only in the hamate.  The trial prosthesis was placed.  It was found to be slightly tight and a second cut was made on the distal radius. The shaft was then rereamed and a prosthesis placed.  The trial was then inserted.  A standard polyethylene component  was placed on to the trials and found to lie in good position with good flexion and extension radial and ulnar deviation of her wrist.  It was very stable.  The trial was removed.  The ends of the bones were copiously irrigated with a bacitracin-containing saline solution.  A medium sized prosthesis was then inserted proximally and distally and the standard sized cap placed on the polyethylene component distally. Again, the prosthesis was quite stable prior to placement of the prosthesis proximally.  Drill holes were placed into the dorsal distal radius and sutures placed for reattachment of the capsule.  The joint was then irrigated after placement.  X-rays confirmed excellent positioning of the prosthesis.  The capsule was then closed dorsally and sutured with 3-0 Mersilene sutures.  The extensor retinaculum was left partially intact distally and the proximal portion passed beneath the extensor tendon to reinforce the dorsal capsule.  This was sutured into position with a figure-of-eight Mersilene sutures.  Subcutaneous tissue was then closed after irrigation with interrupted 4-0 Vicryl and the skin with interrupted 5-0 nylon suture.  Sterile compressive dressing and long-arm splint applied.  The patient tolerated the procedure well and was taken to the recovery room for observation in satisfactory condition.  She is admitted for overnight stay for pain control and antibiotics. Dictated by:   Nicki Reaper, M.D. Attending Physician:  Ronne Binning DD:  06/29/01 TD:  06/29/01 Job: [id number]

## 2011-01-02 ENCOUNTER — Emergency Department (HOSPITAL_COMMUNITY): Payer: Medicare Other

## 2011-01-02 ENCOUNTER — Emergency Department (HOSPITAL_COMMUNITY)
Admission: EM | Admit: 2011-01-02 | Discharge: 2011-01-02 | Disposition: A | Discharge status: Home / Self Care | Payer: Medicare Other | Attending: Emergency Medicine | Admitting: Emergency Medicine

## 2011-01-02 DIAGNOSIS — S32509A Unspecified fracture of unspecified pubis, initial encounter for closed fracture: Secondary | ICD-10-CM | POA: Insufficient documentation

## 2011-01-02 DIAGNOSIS — W108XXA Fall (on) (from) other stairs and steps, initial encounter: Secondary | ICD-10-CM | POA: Insufficient documentation

## 2011-01-02 DIAGNOSIS — M25559 Pain in unspecified hip: Secondary | ICD-10-CM | POA: Insufficient documentation

## 2011-01-02 DIAGNOSIS — E7800 Pure hypercholesterolemia, unspecified: Secondary | ICD-10-CM | POA: Insufficient documentation

## 2011-01-02 DIAGNOSIS — M129 Arthropathy, unspecified: Secondary | ICD-10-CM | POA: Insufficient documentation

## 2011-01-02 DIAGNOSIS — I10 Essential (primary) hypertension: Secondary | ICD-10-CM | POA: Insufficient documentation

## 2011-01-02 DIAGNOSIS — R937 Abnormal findings on diagnostic imaging of other parts of musculoskeletal system: Secondary | ICD-10-CM | POA: Insufficient documentation

## 2011-01-02 DIAGNOSIS — M069 Rheumatoid arthritis, unspecified: Secondary | ICD-10-CM | POA: Insufficient documentation

## 2011-01-02 DIAGNOSIS — K219 Gastro-esophageal reflux disease without esophagitis: Secondary | ICD-10-CM | POA: Insufficient documentation

## 2011-01-06 LAB — CBC
MCV: 78.7
Platelets: 356
WBC: 15.3 — ABNORMAL HIGH

## 2011-01-06 LAB — BASIC METABOLIC PANEL
BUN: 17
Creatinine, Ser: 0.77
GFR calc non Af Amer: >60

## 2011-01-07 ENCOUNTER — Other Ambulatory Visit: Payer: Self-pay | Admitting: Sports Medicine

## 2011-01-07 DIAGNOSIS — M25552 Pain in left hip: Secondary | ICD-10-CM

## 2011-01-08 ENCOUNTER — Ambulatory Visit
Admission: RE | Admit: 2011-01-08 | Discharge: 2011-01-08 | Disposition: A | Discharge status: Home / Self Care | Payer: Medicare Other | Source: Ambulatory Visit | Attending: Sports Medicine | Admitting: Sports Medicine

## 2011-01-08 DIAGNOSIS — M25552 Pain in left hip: Secondary | ICD-10-CM

## 2011-01-09 ENCOUNTER — Ambulatory Visit
Admission: RE | Admit: 2011-01-09 | Discharge: 2011-01-09 | Disposition: A | Discharge status: Home / Self Care | Payer: Medicare Other | Source: Ambulatory Visit | Attending: Physician Assistant | Admitting: Physician Assistant

## 2011-01-09 ENCOUNTER — Other Ambulatory Visit: Payer: Self-pay | Admitting: Physician Assistant

## 2011-01-09 DIAGNOSIS — R52 Pain, unspecified: Secondary | ICD-10-CM

## 2011-01-27 ENCOUNTER — Ambulatory Visit
Admission: RE | Admit: 2011-01-27 | Discharge: 2011-01-27 | Disposition: A | Discharge status: Home / Self Care | Payer: Medicare Other | Source: Ambulatory Visit | Attending: Pain Medicine | Admitting: Pain Medicine

## 2011-01-27 ENCOUNTER — Other Ambulatory Visit: Payer: Self-pay | Admitting: Pain Medicine

## 2011-01-27 DIAGNOSIS — M25559 Pain in unspecified hip: Secondary | ICD-10-CM

## 2011-01-27 DIAGNOSIS — M549 Dorsalgia, unspecified: Secondary | ICD-10-CM

## 2011-05-04 ENCOUNTER — Ambulatory Visit
Admission: RE | Admit: 2011-05-04 | Discharge: 2011-05-04 | Disposition: A | Discharge status: Home / Self Care | Payer: Medicare Other | Source: Ambulatory Visit | Attending: Sports Medicine | Admitting: Sports Medicine

## 2011-05-04 ENCOUNTER — Other Ambulatory Visit: Payer: Self-pay | Admitting: Sports Medicine

## 2011-05-04 DIAGNOSIS — T148XXA Other injury of unspecified body region, initial encounter: Secondary | ICD-10-CM

## 2011-12-01 ENCOUNTER — Other Ambulatory Visit: Payer: Self-pay | Admitting: Pain Medicine

## 2011-12-01 DIAGNOSIS — M549 Dorsalgia, unspecified: Secondary | ICD-10-CM

## 2011-12-11 ENCOUNTER — Ambulatory Visit
Admission: RE | Admit: 2011-12-11 | Discharge: 2011-12-11 | Disposition: A | Discharge status: Home / Self Care | Payer: Medicare Other | Source: Ambulatory Visit | Attending: Pain Medicine | Admitting: Pain Medicine

## 2011-12-11 DIAGNOSIS — M549 Dorsalgia, unspecified: Secondary | ICD-10-CM

## 2012-11-05 IMAGING — CT CT HIP*L* W/O CM
1 series · 16 of 28 positions shown, 20 images · non-contrast
Comparison: Plain films left hip [DATE]/7117 1617 hours.

CLINICAL DATA: Status post fall 1 day ago.  Left hip pain.
Question fracture.

CT OF THE LEFT HIP WITHOUT CONTRAST
TECHNIQUE: Multidetector CT imaging was performed according to the
standard protocol. Multiplanar CT image reconstructions were also
generated.

[Series 3: pelvis st · axial · 0.42mm/px · z∈[+736,+861]mm · 16 of 28 slices shown, 20 images]
[im 2/28  soft-tissue]
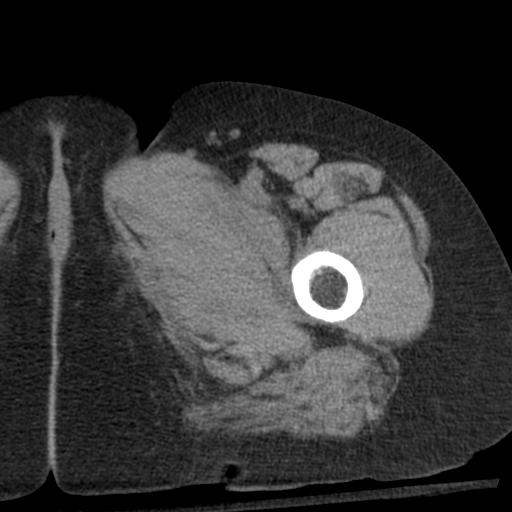
[im 2/28  bone]
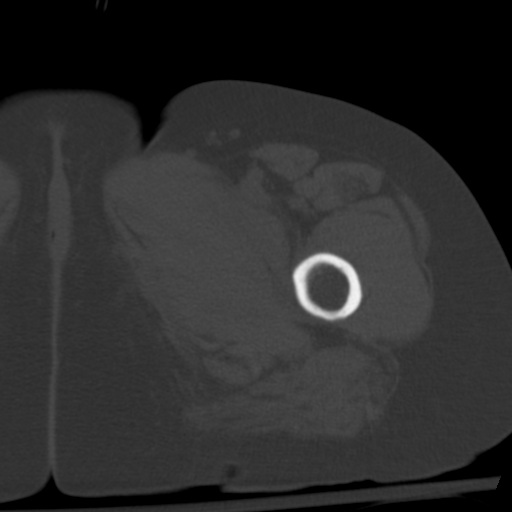
[im 4/28  soft-tissue]
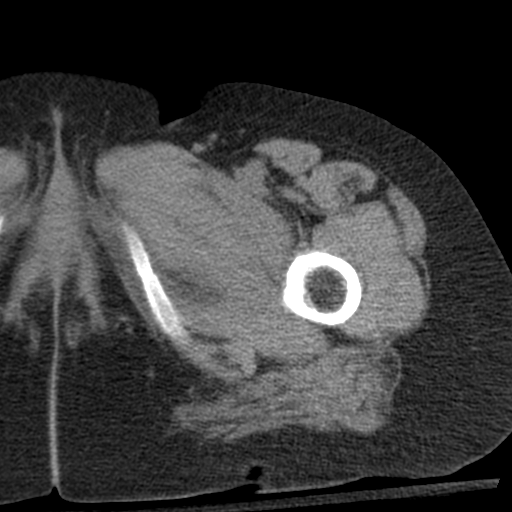
[im 6/28  soft-tissue]
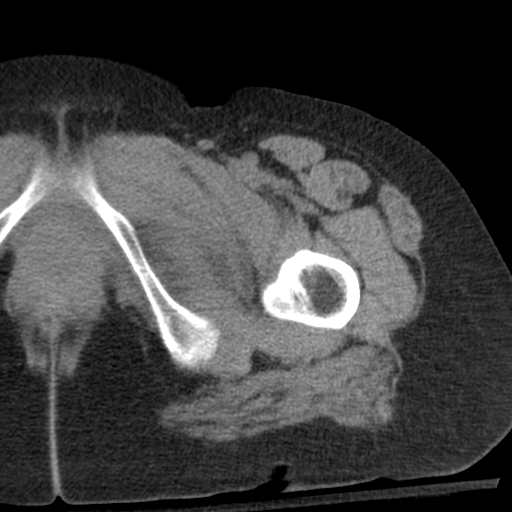
[im 8/28  soft-tissue]
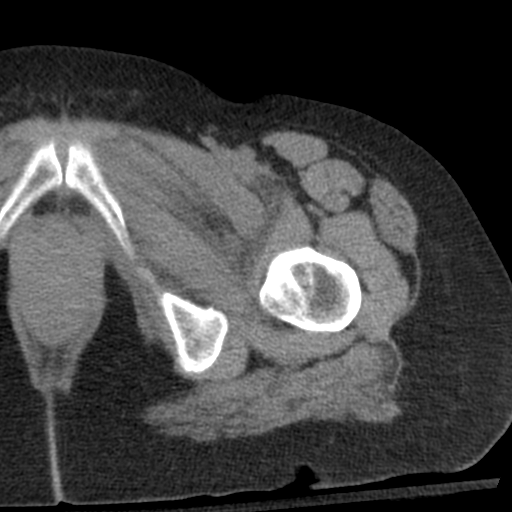
[im 10/28  soft-tissue]
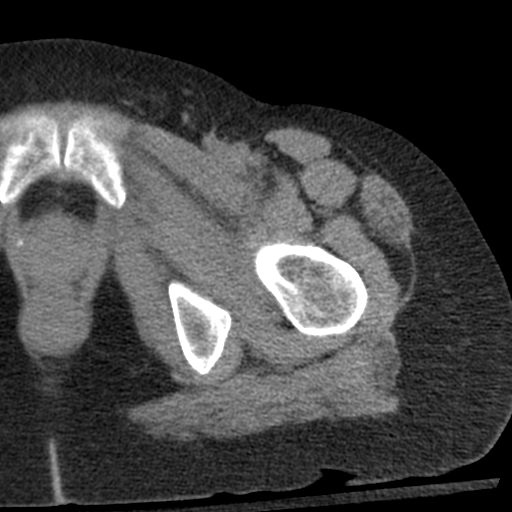
[im 12/28  soft-tissue]
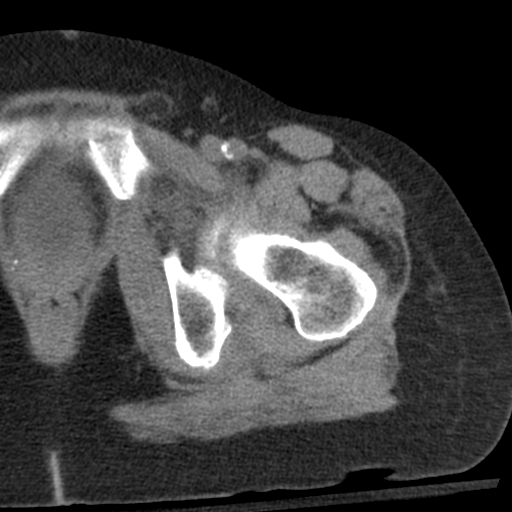
[im 14/28  soft-tissue]
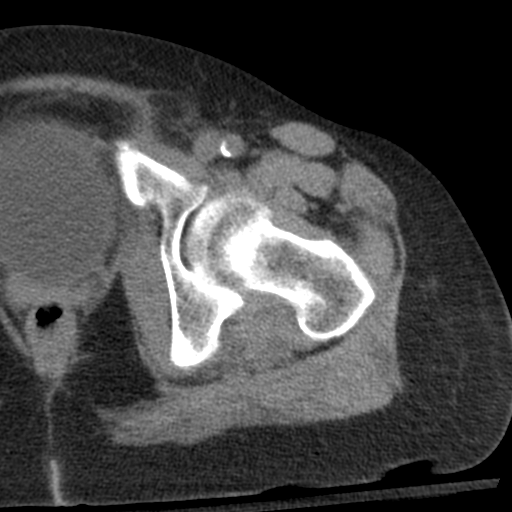
[im 15/28  soft-tissue]
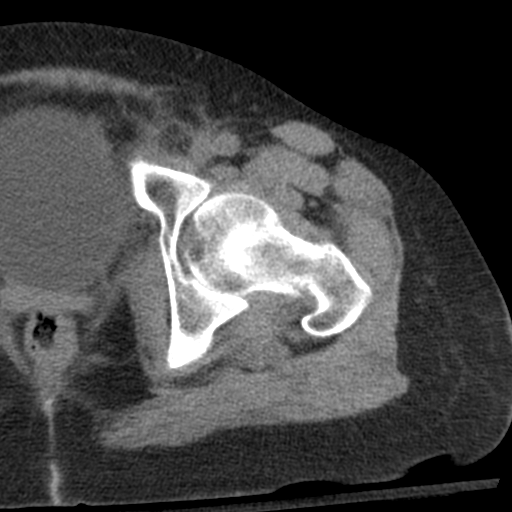
[im 17/28  soft-tissue]
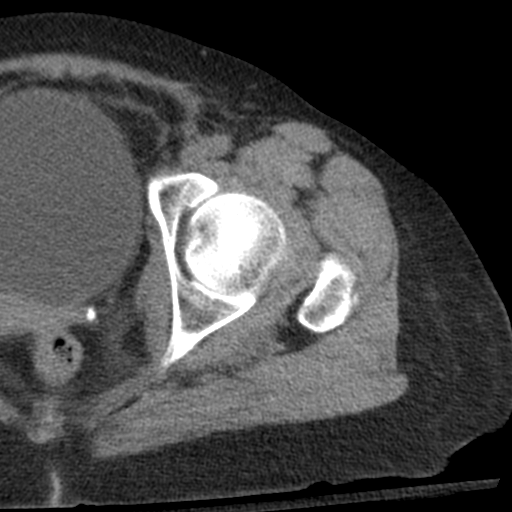
[im 17/28  bone]
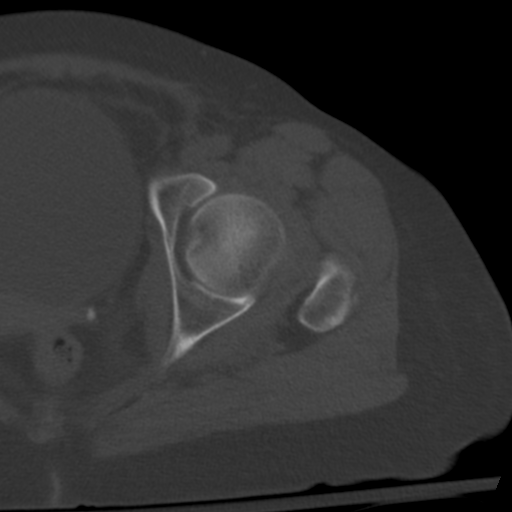
[im 19/28  soft-tissue]
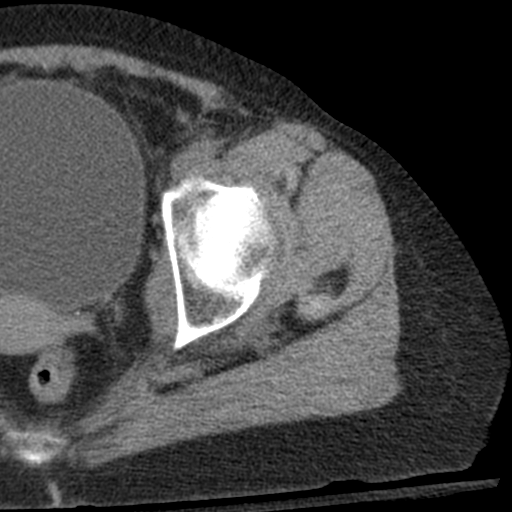
[im 21/28  soft-tissue]
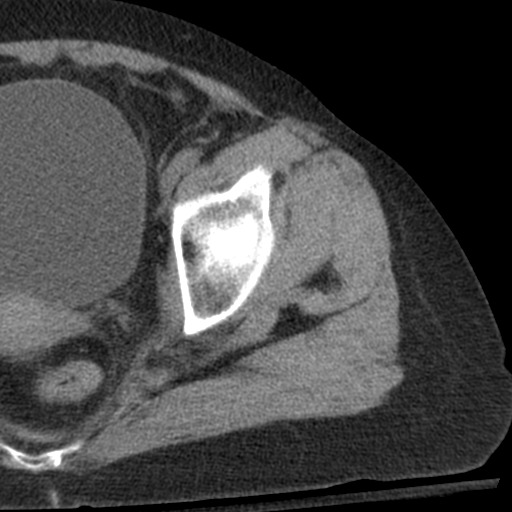
[im 23/28  soft-tissue]
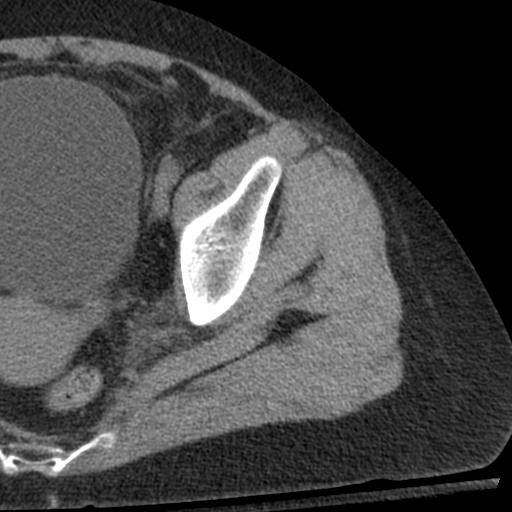
[im 24/28  lung]
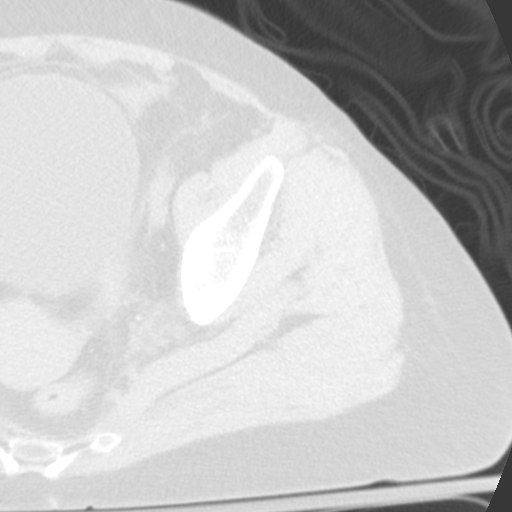
[im 25/28  soft-tissue]
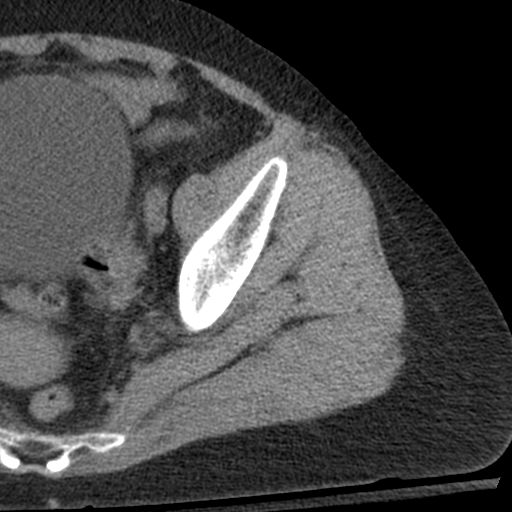
[im 25/28  lung]
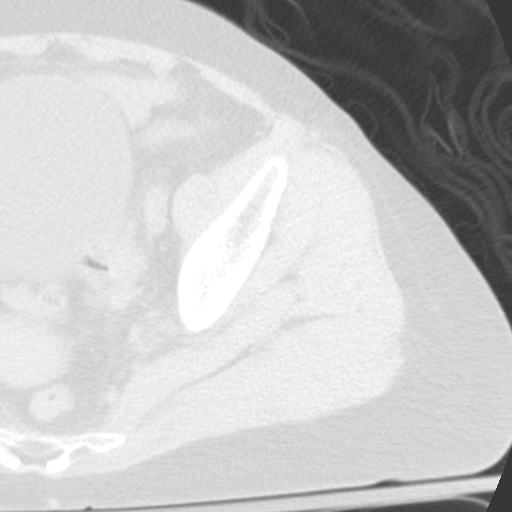
[im 26/28  lung]
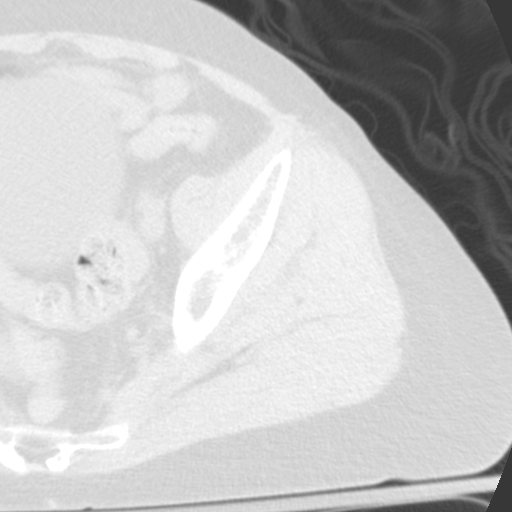
[im 27/28  soft-tissue]
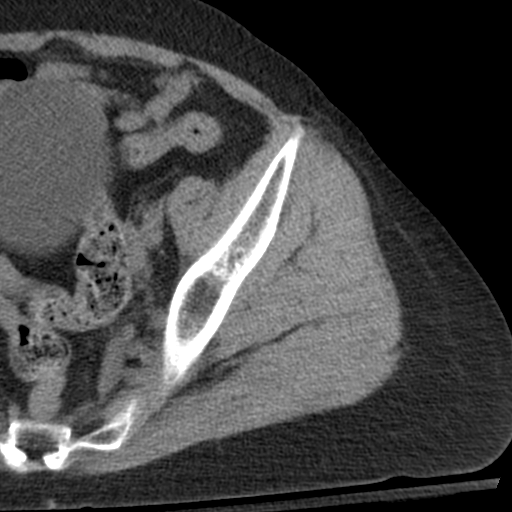
[im 27/28  lung]
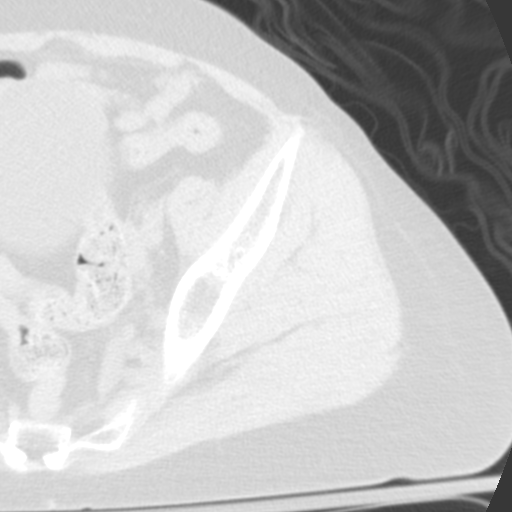

[16 of 28 positions shown; findings below may reference images not displayed]

FINDINGS: The patient has nondisplaced left superior and inferior
pubic rami fractures.  There is a small amount of hematoma about
the patient's fractures.  No hip fracture is identified.  Both hips
are located.  No hip joint effusion is noted.  Imaged intrapelvic
contents are unremarkable.
IMPRESSION: Nondisplaced left superior and inferior pubic rami fractures.
Negative for hip fracture.

## 2012-11-05 IMAGING — CR DG HIP (WITH OR WITHOUT PELVIS) 2-3V*L*
3 series · 3 of 3 positions shown · non-contrast
Comparison: None

CLINICAL DATA: Fell yesterday with left hip pain

LEFT HIP - COMPLETE 2+ VIEW

[t pelvis ap]
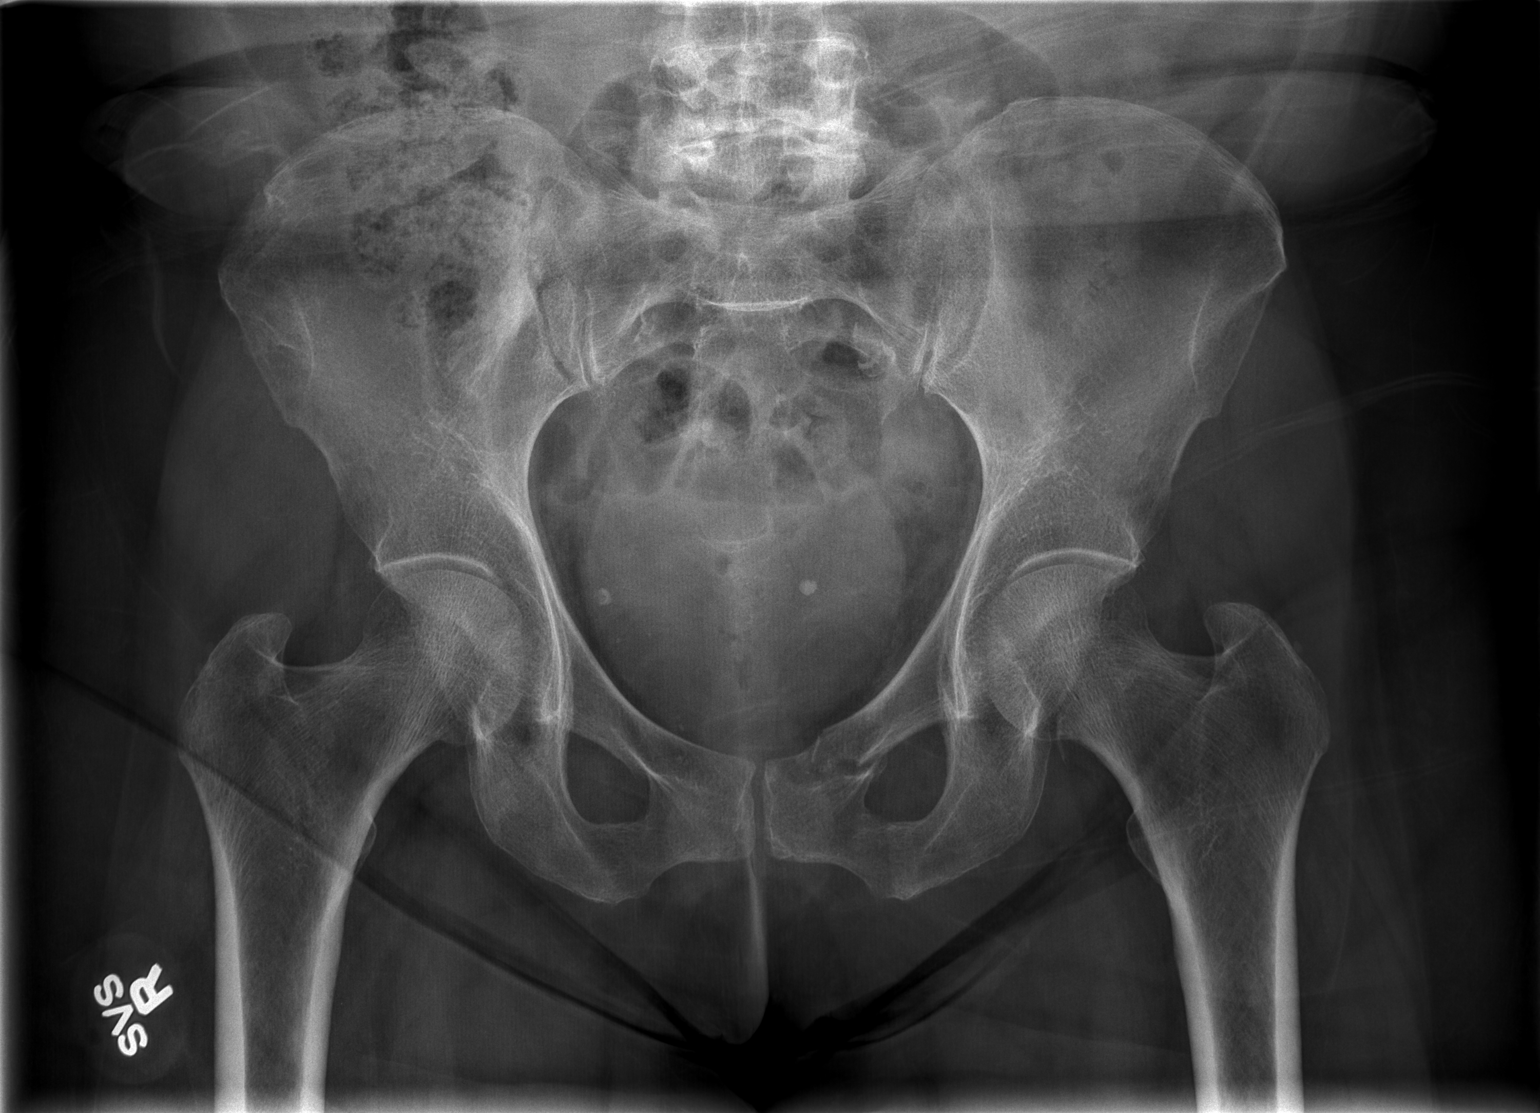

[t hip ap left]
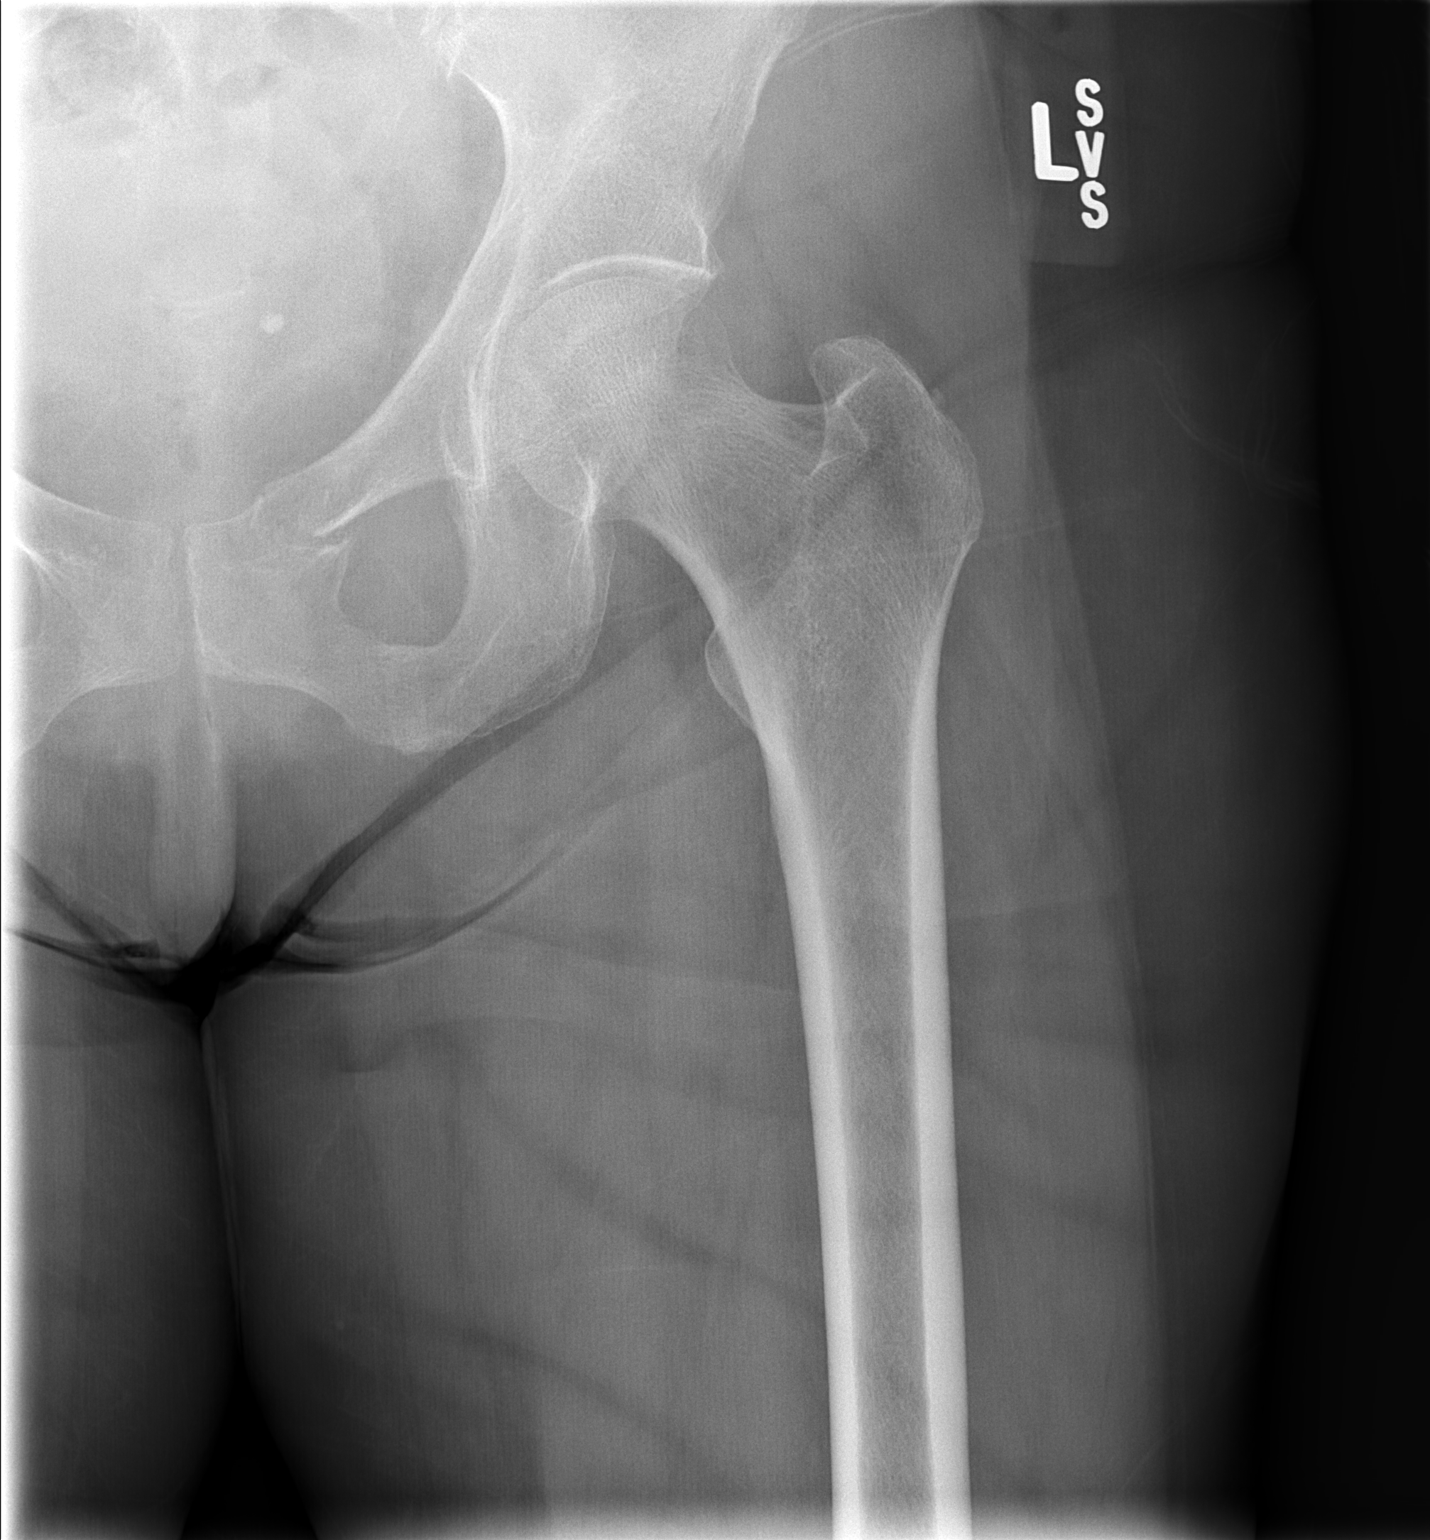

[t hip frog leg left]
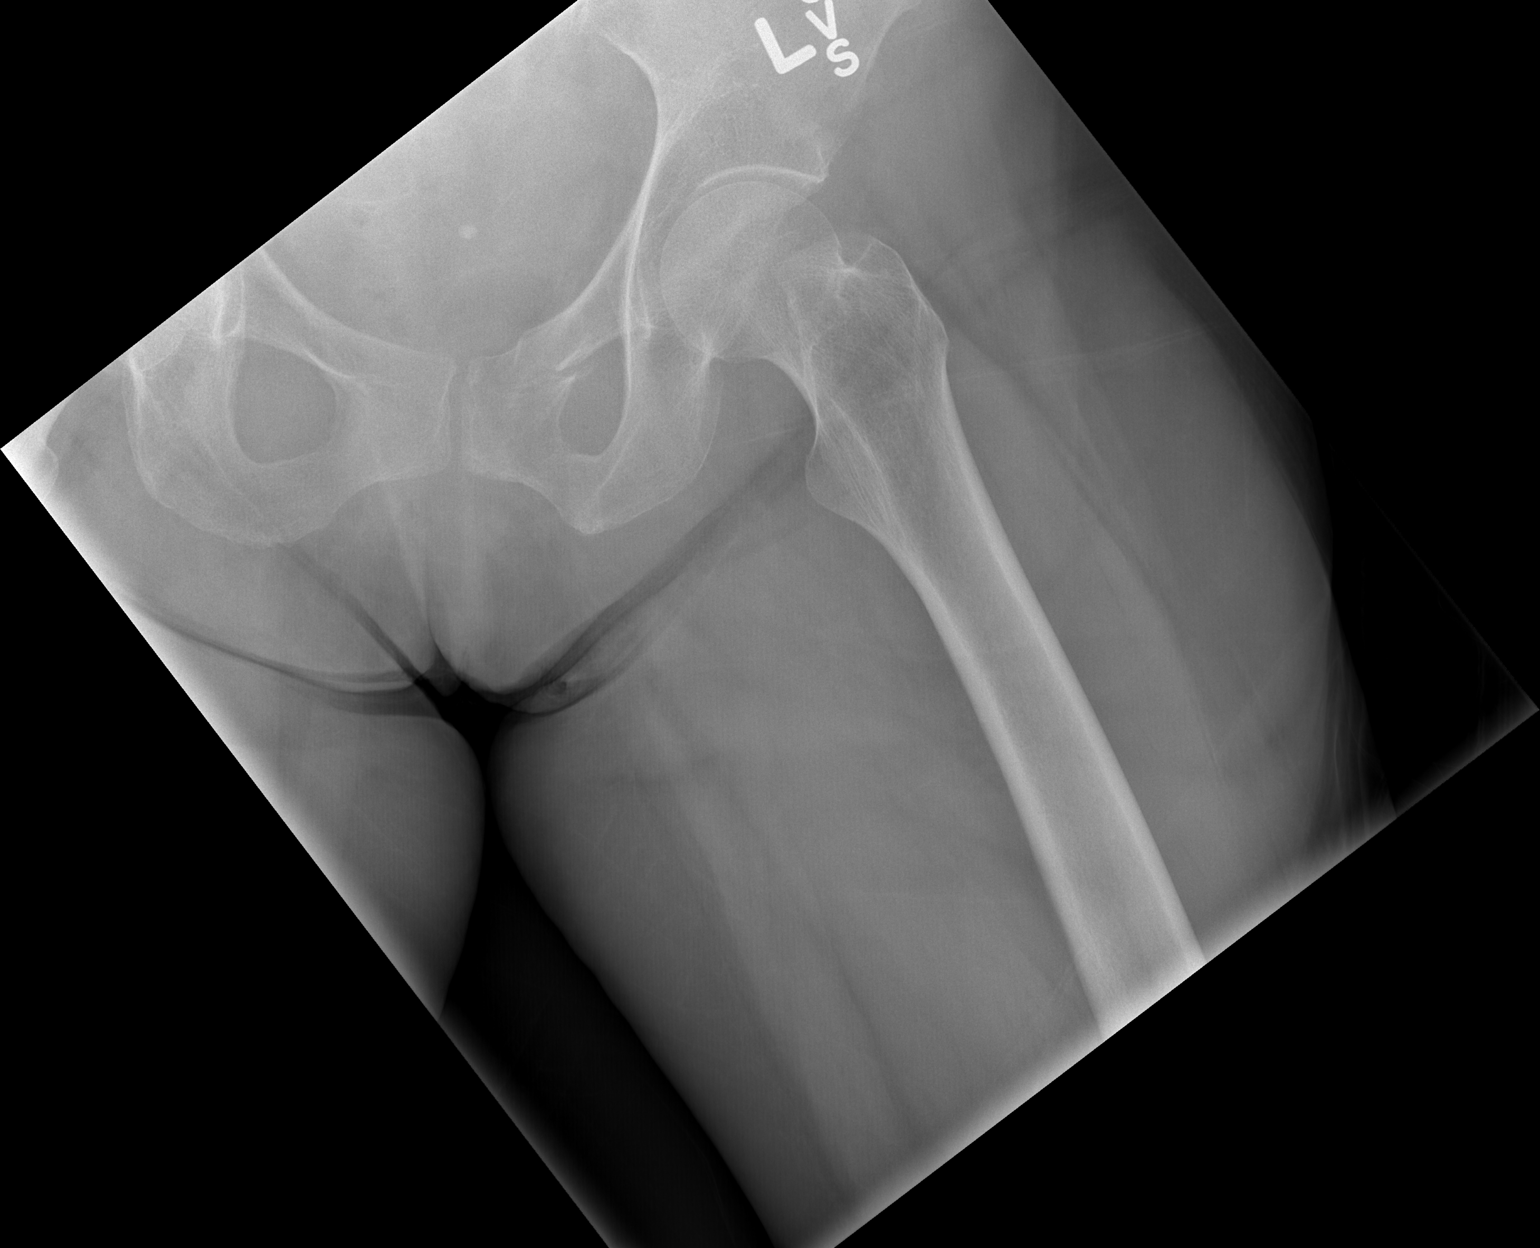

[3 of 3 positions shown; findings below may reference images not displayed]

FINDINGS: The left hip appears to be normal position with no acute
abnormality.  However there is a acute fracture of the left
superior ramus.  No other definite pelvic fracture is seen.  The SI
joints appear normal.
IMPRESSION: Fracture of the left superior ramus.  No other abnormality.

## 2012-11-05 IMAGING — CR DG CHEST 1V
1 series · 1 of 1 positions shown · non-contrast
Comparison: Chest x-ray of 12/10/2010

CLINICAL DATA: Fell yesterday with left hip pain, cough,
hypertension

CHEST - 1 VIEW

[t chest supine]
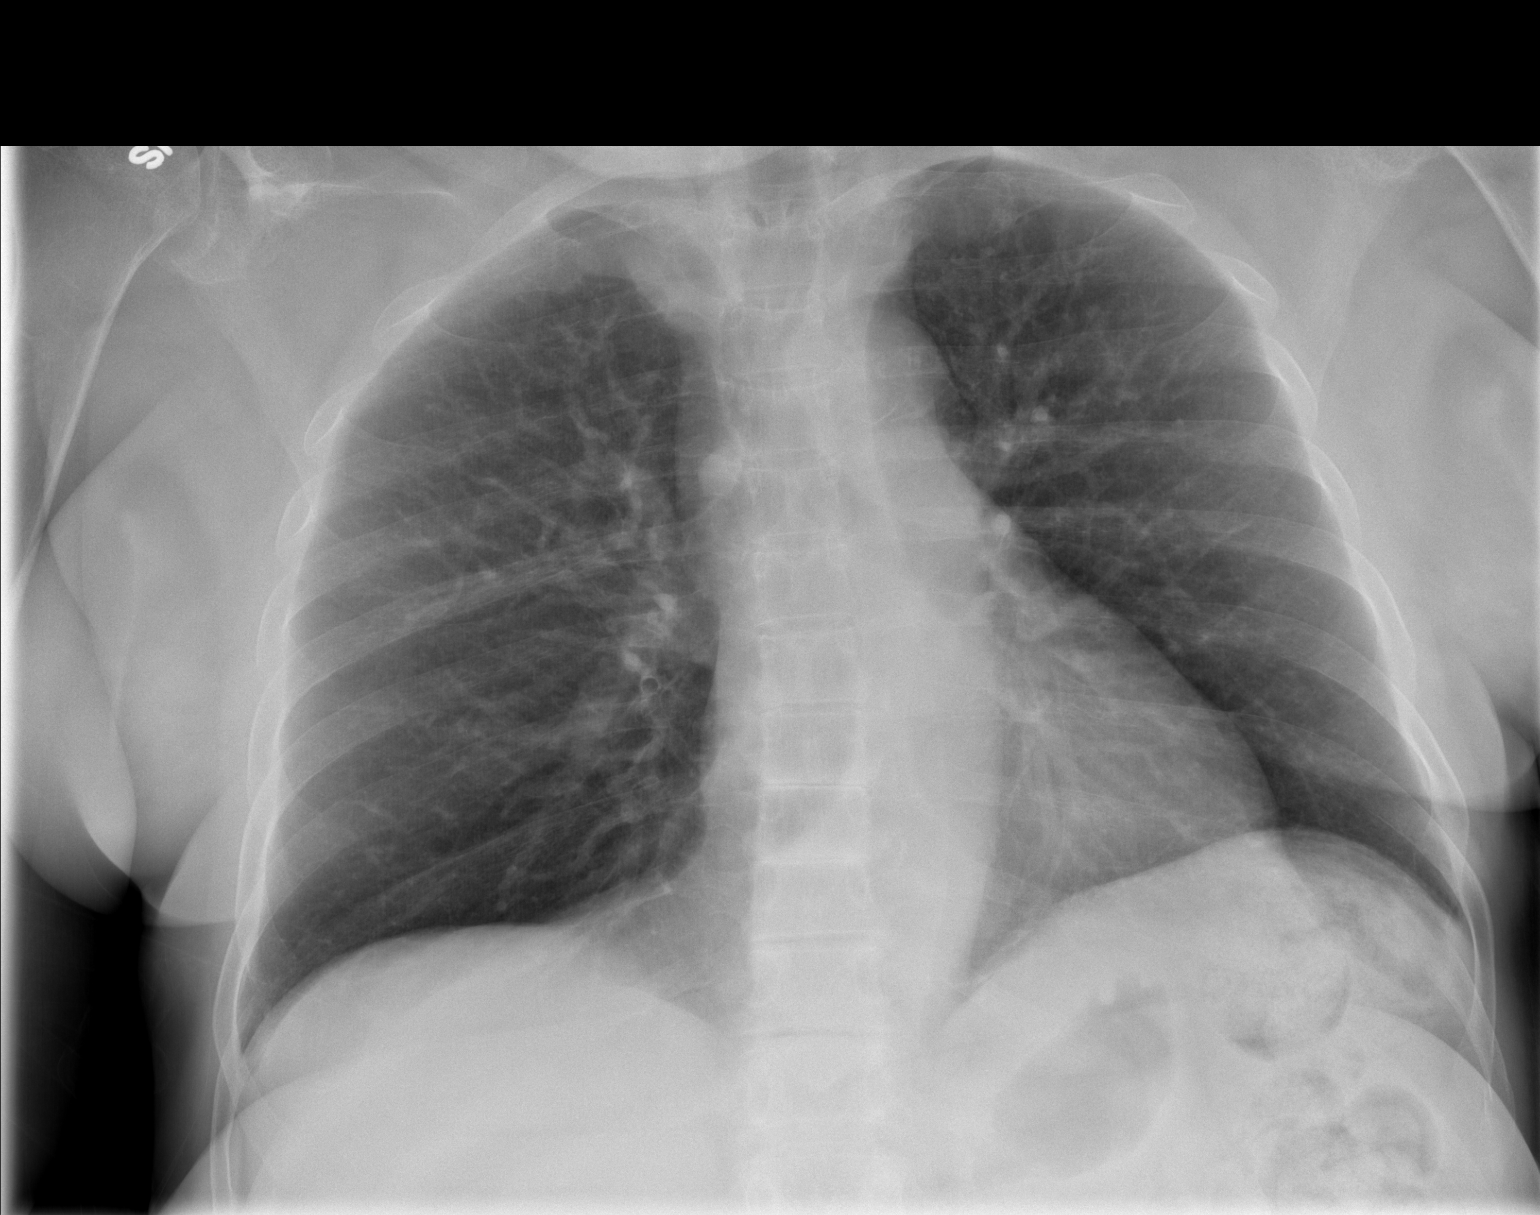

[1 of 1 positions shown; findings below may reference images not displayed]

FINDINGS: The lungs are clear and slightly hyperaerated.
Mediastinal contours appear normal.  The heart is mildly enlarged
and stable.  No acute bony abnormality is seen.  The bones are
osteopenic.
IMPRESSION: No active lung disease.  Slight hyperaeration.  Stable mild
cardiomegaly.

## 2013-06-15 ENCOUNTER — Other Ambulatory Visit: Payer: Self-pay | Admitting: Neurosurgery

## 2013-06-15 DIAGNOSIS — M4310 Spondylolisthesis, site unspecified: Secondary | ICD-10-CM

## 2013-06-20 ENCOUNTER — Ambulatory Visit
Admission: RE | Admit: 2013-06-20 | Discharge: 2013-06-20 | Disposition: A | Discharge status: Home / Self Care | Payer: PRIVATE HEALTH INSURANCE | Source: Ambulatory Visit | Attending: Neurosurgery | Admitting: Neurosurgery

## 2013-06-20 VITALS — BP 195/103 | HR 84

## 2013-06-20 DIAGNOSIS — M4310 Spondylolisthesis, site unspecified: Secondary | ICD-10-CM

## 2013-06-20 MED ORDER — IOHEXOL 180 MG/ML  SOLN
1.0000 mL | Freq: Once | INTRAMUSCULAR | Status: AC | PRN
  Administered 2013-06-20: 1 mL via EPIDURAL

## 2013-06-20 MED ORDER — METHYLPREDNISOLONE ACETATE 40 MG/ML INJ SUSP (RADIOLOG
120.0000 mg | Freq: Once | INTRAMUSCULAR | Status: AC
  Administered 2013-06-20: 120 mg via EPIDURAL

## 2013-06-20 NOTE — Discharge Instructions (Signed)

## 2013-08-04 ENCOUNTER — Emergency Department (HOSPITAL_COMMUNITY): Payer: PRIVATE HEALTH INSURANCE

## 2013-08-04 ENCOUNTER — Encounter (HOSPITAL_COMMUNITY): Payer: Self-pay | Admitting: Emergency Medicine

## 2013-08-04 ENCOUNTER — Emergency Department (HOSPITAL_COMMUNITY)
Admission: EM | Admit: 2013-08-04 | Discharge: 2013-08-04 | Disposition: A | Discharge status: Home / Self Care | Payer: PRIVATE HEALTH INSURANCE | Attending: Emergency Medicine | Admitting: Emergency Medicine

## 2013-08-04 DIAGNOSIS — Y939 Activity, unspecified: Secondary | ICD-10-CM | POA: Insufficient documentation

## 2013-08-04 DIAGNOSIS — W010XXA Fall on same level from slipping, tripping and stumbling without subsequent striking against object, initial encounter: Secondary | ICD-10-CM | POA: Insufficient documentation

## 2013-08-04 DIAGNOSIS — S42213A Unspecified displaced fracture of surgical neck of unspecified humerus, initial encounter for closed fracture: Secondary | ICD-10-CM | POA: Insufficient documentation

## 2013-08-04 DIAGNOSIS — F17200 Nicotine dependence, unspecified, uncomplicated: Secondary | ICD-10-CM | POA: Insufficient documentation

## 2013-08-04 DIAGNOSIS — S42309A Unspecified fracture of shaft of humerus, unspecified arm, initial encounter for closed fracture: Secondary | ICD-10-CM

## 2013-08-04 DIAGNOSIS — Z8739 Personal history of other diseases of the musculoskeletal system and connective tissue: Secondary | ICD-10-CM | POA: Insufficient documentation

## 2013-08-04 DIAGNOSIS — Z91040 Latex allergy status: Secondary | ICD-10-CM | POA: Insufficient documentation

## 2013-08-04 DIAGNOSIS — Y929 Unspecified place or not applicable: Secondary | ICD-10-CM | POA: Insufficient documentation

## 2013-08-04 HISTORY — DX: Unspecified osteoarthritis, unspecified site: M19.90

## 2013-08-04 MED ORDER — HYDROCODONE-ACETAMINOPHEN 5-325 MG PO TABS
1.0000 | ORAL_TABLET | Freq: Once | ORAL | Status: AC
  Administered 2013-08-04: 1 via ORAL
  Filled 2013-08-04: qty 1

## 2013-08-04 MED ORDER — OXYCODONE-ACETAMINOPHEN 5-325 MG PO TABS: 1.0000 | ORAL_TABLET | Freq: Four times a day (QID) | ORAL | Status: DC | PRN

## 2013-08-04 NOTE — Discharge Instructions (Signed)
Return here as needed. Follow up with the orthopedist provided. Ice to your shoulder.

## 2013-08-04 NOTE — ED Notes (Addendum)
She tripped and fell onto her L arm last night and shes had L upper arm pain from elbow to shoulder since. She states it hurts to move the arm. She can wiggle her digits, skin w/d, pulses intact

## 2013-08-04 NOTE — ED Provider Notes (Signed)
CSN: [id number]     Arrival date & time 08/04/13  1243 History  This chart was scribed for non-physician practitioner, Ebbie Ridge, PA-C working with Shelda Jakes, MD by Luisa Dago, ED scribe. This patient was seen in room TR05C/TR05C and the patient's care was started at 2:13 PM.    Chief Complaint  Patient presents with  . Arm Pain   The history is provided by the patient. No language interpreter was used.   HPI Comments: Jenna Medina is a 63 y.o. female who presents to the Emergency Department complaining of worsening left shoulder pain that started last night. Pt states that he tripped and fell unto her left arm. She states that the pain is worsened by movement and radiates down her left arm. Pt denies any head trauma or LOC. She denies any fever, diaphoresis, nausea, emesis, numbness, tingling, SOB, neck pain, or chest pain.   Past Medical History  Diagnosis Date  . Arthritis    Past Surgical History  Procedure Laterality Date  . Back surgery     History reviewed. No pertinent family history. History  Substance Use Topics  . Smoking status: Current Some Day Smoker    Types: Cigarettes  . Smokeless tobacco: Not on file  . Alcohol Use: No   OB History   Grav Para Term Preterm Abortions TAB SAB Ect Mult Living                 Review of Systems A complete 10 system review of systems was obtained and all systems are negative except as noted in the HPI and PMH. ]   Allergies  Latex  Home Medications   Prior to Admission medications   Not on File   BP 139/115  Pulse 98  Temp(Src) 99 F (37.2 C) (Oral)  Resp 18  SpO2 97%  Physical Exam  Nursing note and vitals reviewed. Constitutional: She is oriented to person, place, and time. She appears well-developed and well-nourished. No distress.  HENT:  Head: Normocephalic and atraumatic.  Neck: Neck supple.  Cardiovascular: Normal rate.   Pulmonary/Chest: Effort normal.  Musculoskeletal: Normal range of  motion.       Left upper arm: She exhibits tenderness, bony tenderness and swelling. She exhibits no edema, no deformity and no laceration.       Arms: Neurological: She is alert and oriented to person, place, and time.  Skin: Skin is warm and dry.  Psychiatric: She has a normal mood and affect. Her behavior is normal.    ED Course  Procedures (including critical care time)  DIAGNOSTIC STUDIES: Oxygen Saturation is 97% on RA, adequate by my interpretation.    COORDINATION OF CARE: 2:15 PM-Patient / Family / Caregiver informed of clinical course, understand medical decision-making process, and agree with plan.  Imaging Review Dg Forearm Left  08/04/2013   CLINICAL DATA:  Larey Seat last night, anterior mid shaft LEFT humeral pain radiating to LEFT wrist  EXAM: LEFT FOREARM - 2 VIEW  COMPARISON:  None  FINDINGS: Posttraumatic deformity distal ulna.  Marked osseous demineralization.  LEFT wrist prosthesis.  Mild lucency seen surrounding the central peg of the wrist prosthesis, unchanged.  Elbow joint alignment normal.  Posttraumatic and postsurgical deformities of the carpus, grossly stable.  No acute fracture, dislocation, or bone destruction.  IMPRESSION: Extensive posttraumatic and postsurgical changes of the LEFT wrist similar to prior exam.  Osseous demineralization.  No acute abnormalities.   Electronically Signed   By: Angelyn Punt.D.  On: 08/04/2013 14:08   Dg Humerus Left  08/04/2013   CLINICAL DATA:  LEFT humeral pain post fall last night  EXAM: LEFT HUMERUS - 2+ VIEW  COMPARISON:  LEFT shoulder radiographs 11/18/2006  FINDINGS: Marked osseous demineralization.  AC joint alignment normal.  Comminuted essentially nondisplaced fracture at surgical neck LEFT humerus.  No dislocation identified on AP humeral exam.  No additional humeral fracture or bone destruction identified.  IMPRESSION: Comminuted nondisplaced fracture at surgical neck LEFT humerus.  Osseous demineralization.   Electronically  Signed   By: Ulyses Southward M.D.   On: 08/04/2013 14:10   Patient be referred to orthopedics, and advised to use ice and heat on her shoulder.  Told to return here as needed.The patient has no other injuries and just landed mainly on her shoulder. The patient denies head injury or neck pain.   I personally performed the services described in this documentation, which was scribed in my presence. The recorded information has been reviewed and is accurate.    Carlyle Dolly, PA-C 08/04/13 1439

## 2013-08-07 NOTE — ED Provider Notes (Signed)
Medical screening examination/treatment/procedure(s) were performed by non-physician practitioner and as supervising physician I was immediately available for consultation/collaboration.   EKG Interpretation None        Shelda Jakes, MD 08/07/13 [phone]

## 2013-08-18 ENCOUNTER — Emergency Department (HOSPITAL_COMMUNITY): Payer: PRIVATE HEALTH INSURANCE

## 2013-08-18 ENCOUNTER — Emergency Department (HOSPITAL_COMMUNITY)
Admission: EM | Admit: 2013-08-18 | Discharge: 2013-08-18 | Disposition: A | Discharge status: Home / Self Care | Payer: PRIVATE HEALTH INSURANCE | Attending: Emergency Medicine | Admitting: Emergency Medicine

## 2013-08-18 ENCOUNTER — Encounter (HOSPITAL_COMMUNITY): Payer: Self-pay | Admitting: Emergency Medicine

## 2013-08-18 DIAGNOSIS — Z79899 Other long term (current) drug therapy: Secondary | ICD-10-CM | POA: Insufficient documentation

## 2013-08-18 DIAGNOSIS — IMO0002 Reserved for concepts with insufficient information to code with codable children: Secondary | ICD-10-CM | POA: Insufficient documentation

## 2013-08-18 DIAGNOSIS — Z91040 Latex allergy status: Secondary | ICD-10-CM | POA: Insufficient documentation

## 2013-08-18 DIAGNOSIS — J3489 Other specified disorders of nose and nasal sinuses: Secondary | ICD-10-CM | POA: Insufficient documentation

## 2013-08-18 DIAGNOSIS — R05 Cough: Secondary | ICD-10-CM | POA: Insufficient documentation

## 2013-08-18 DIAGNOSIS — F17200 Nicotine dependence, unspecified, uncomplicated: Secondary | ICD-10-CM | POA: Insufficient documentation

## 2013-08-18 DIAGNOSIS — Z791 Long term (current) use of non-steroidal anti-inflammatories (NSAID): Secondary | ICD-10-CM | POA: Insufficient documentation

## 2013-08-18 DIAGNOSIS — Z8781 Personal history of (healed) traumatic fracture: Secondary | ICD-10-CM | POA: Insufficient documentation

## 2013-08-18 DIAGNOSIS — Z7982 Long term (current) use of aspirin: Secondary | ICD-10-CM | POA: Insufficient documentation

## 2013-08-18 DIAGNOSIS — R059 Cough, unspecified: Secondary | ICD-10-CM | POA: Insufficient documentation

## 2013-08-18 DIAGNOSIS — M129 Arthropathy, unspecified: Secondary | ICD-10-CM | POA: Insufficient documentation

## 2013-08-18 MED ORDER — AZITHROMYCIN 250 MG PO TABS: 250.0000 mg | ORAL_TABLET | Freq: Every day | ORAL | Status: DC

## 2013-08-18 NOTE — ED Notes (Signed)
Pt. reports productive cough with nasal congestion onset last week , denies fever or chills. Respirations unlabored.

## 2013-08-18 NOTE — ED Provider Notes (Signed)
CSN: [id number]     Arrival date & time 08/18/13  2144 History  This chart was scribed for Roxy Horseman, PA, working with Flint Melter, MD, by Ardelia Mems ED Scribe. This patient was seen in room TR05C/TR05C and the patient's care was started at 11:09 PM.   Chief Complaint  Patient presents with  . Cough    The history is provided by the patient. No language interpreter was used.    HPI Comments: Jenna Medina is a 63 y.o. Female who presents to the Emergency Department with a chief complaint of a cough, productive of yellow phlegm, with specks of blood over the past week. She reports associated nasal congestion. She states that she has taken Robitussin without relief of her cough. She denies noticing any associated fever, but also states that she does not own a thermometer. Her temperature in the ED is 98.9 F. She states that she was in the ED 1 week ago after fracturing her left humerus.  She did not have surgery. She denies SOB or chest pain.  Denies any other pain or symptoms.    Past Medical History  Diagnosis Date  . Arthritis    Past Surgical History  Procedure Laterality Date  . Back surgery     No family history on file. History  Substance Use Topics  . Smoking status: Current Some Day Smoker    Types: Cigarettes  . Smokeless tobacco: Not on file  . Alcohol Use: No   OB History   Grav Para Term Preterm Abortions TAB SAB Ect Mult Living                 Review of Systems  Constitutional: Negative for fever.  HENT: Positive for congestion.   Respiratory: Positive for cough.     Allergies  Motrin and Latex  Home Medications   Prior to Admission medications   Medication Sig Start Date End Date Taking? Authorizing Provider  alendronate (FOSAMAX) 70 MG tablet Take 70 mg by mouth once a week. Sunday.   Take with a full glass of water on an empty stomach.    Historical Provider, MD  aspirin EC 81 MG tablet Take 81 mg by mouth daily.    Historical Provider, MD   B Complex-C (B-COMPLEX WITH VITAMIN C) tablet Take 1 tablet by mouth daily.    Historical Provider, MD  baclofen (LIORESAL) 10 MG tablet Take 10 mg by mouth 3 (three) times daily as needed for muscle spasms.  07/27/13   Historical Provider, MD  calcium carbonate (OS-CAL) 600 MG TABS tablet Take 600 mg by mouth daily with breakfast.    Historical Provider, MD  cholecalciferol (VITAMIN D) 400 UNITS TABS tablet Take 400 Units by mouth daily.    Historical Provider, MD  cyclobenzaprine (FLEXERIL) 10 MG tablet Take 10 mg by mouth 3 (three) times daily as needed for muscle spasms.    Historical Provider, MD  ENBREL SURECLICK 50 MG/ML injection Inject 50 mg as directed once a week. wednesday 07/27/13   Historical Provider, MD  esomeprazole (NEXIUM) 40 MG capsule Take 40 mg by mouth daily at 12 noon.    Historical Provider, MD  ferrous sulfate 325 (65 FE) MG tablet Take 325 mg by mouth daily with breakfast.    Historical Provider, MD  folic acid (FOLVITE) 1 MG tablet Take 1 mg by mouth daily.    Historical Provider, MD  HYDROmorphone HCl (EXALGO) 12 MG T24A SR tablet Take 12 mg by mouth daily.  Historical Provider, MD  lisinopril (PRINIVIL,ZESTRIL) 30 MG tablet Take 30 mg by mouth daily.  07/31/13   Historical Provider, MD  LYRICA 50 MG capsule Take 50 mg by mouth 3 (three) times daily.  07/20/13   Historical Provider, MD  methotrexate (RHEUMATREX) 2.5 MG tablet Take 20 mg by mouth once a week. friday 07/18/13   Historical Provider, MD  Multiple Vitamin (MULTIVITAMIN WITH MINERALS) TABS tablet Take 1 tablet by mouth daily.    Historical Provider, MD  naproxen (NAPROSYN) 500 MG tablet Take 500 mg by mouth 2 (two) times daily with a meal.    Historical Provider, MD  oxyCODONE-acetaminophen (PERCOCET) 10-325 MG per tablet Take 1 tablet by mouth every 4 (four) hours as needed for pain.    Historical Provider, MD  oxyCODONE-acetaminophen (PERCOCET/ROXICET) 5-325 MG per tablet Take 1 tablet by mouth every 6 (six)  hours as needed for severe pain. 08/04/13   Jamesetta Orleans Lawyer, PA-C  predniSONE (DELTASONE) 1 MG tablet Take 4 mg by mouth daily with breakfast. Take with 5mg  tablet For a total of 9mg  07/05/13   Historical Provider, MD  predniSONE (DELTASONE) 5 MG tablet Take 5 mg by mouth daily with breakfast. Take with #4 - 1mg  tablets for a total of 9mg     Historical Provider, MD   Triage Vitals: BP 151/91  Pulse 88  Temp(Src) 98.9 F (37.2 C) (Oral)  Resp 16  SpO2 97%  Physical Exam  Nursing note and vitals reviewed. Constitutional: She is oriented to person, place, and time. She appears well-developed and well-nourished. No distress.  HENT:  Head: Normocephalic and atraumatic.  Eyes: Conjunctivae and EOM are normal. Pupils are equal, round, and reactive to light.  Neck: Normal range of motion. Neck supple. No tracheal deviation present.  Cardiovascular: Normal rate and regular rhythm.  Exam reveals no gallop and no friction rub.   No murmur heard. Pulmonary/Chest: Effort normal and breath sounds normal. No respiratory distress. She has no wheezes. She has no rales. She exhibits no tenderness.  CTAB  Abdominal: Soft. She exhibits no distension and no mass. There is no tenderness. There is no rebound and no guarding.  Musculoskeletal: Normal range of motion. She exhibits no edema and no tenderness.  Neurological: She is alert and oriented to person, place, and time.  Skin: Skin is warm and dry.  Psychiatric: She has a normal mood and affect. Her behavior is normal. Judgment and thought content normal.    ED Course  Procedures (including critical care time)  DIAGNOSTIC STUDIES: Oxygen Saturation is 97% on RA, normal by my interpretation.    COORDINATION OF CARE: 11:15 PM- Will await the results of pt's CXR. Pt advised of plan for treatment and pt agrees.  Labs Review Labs Reviewed - No data to display  Imaging Review Dg Chest 2 View  08/18/2013   CLINICAL DATA:  One week history of  productive cough and nasal congestion  EXAM: CHEST  2 VIEW  COMPARISON:  Supine chest x-ray dated January 02, 2011  FINDINGS: The lungs are borderline hypoinflated. Mildly increased perihilar lung markings are present on the right inferiorly. The cardiopericardial silhouette is normal in size. The pulmonary vascularity is not engorged. There is marked tortuosity of the descending thoracic aorta. There is no pleural effusion. There are degenerative disc changes at multiple thoracic levels.  IMPRESSION: Mildly increased pulmonary interstitial markings are noted bilaterally but most conspicuously in the right infrahilar region. This may reflect subsegmental atelectasis. There is no definite evidence  of pulmonary edema. A followup PA and lateral chest x-ray with deep inspiration would be useful.   Electronically Signed   By: David  [place]   On: 08/18/2013 23:20     EKG Interpretation None      MDM   Final diagnoses:  Cough    Patient with cough times one week. No fevers or chills. Also complains of nasal congestion. No difficulty breathing. Chest x-ray as above. No shortness of breath. Will treat with azithromycin, and recommend PCP followup. Return for new or worsening symptoms. Patient understands and agrees with plan. She is stable and ready for discharge.  Filed Vitals:   08/18/13 2306  BP: 151/91  Pulse: 88  Temp: 98.9 F (37.2 C)  Resp: 16    I personally performed the services described in this documentation, which was scribed in my presence. The recorded information has been reviewed and is accurate.     Roxy Horseman, PA-C 08/18/13 2328

## 2013-08-18 NOTE — Discharge Instructions (Signed)

## 2013-08-19 NOTE — ED Provider Notes (Signed)
Medical screening examination/treatment/procedure(s) were performed by non-physician practitioner and as supervising physician I was immediately available for consultation/collaboration.  Flint Melter, MD 08/19/13 [phone]

## 2013-09-08 ENCOUNTER — Emergency Department (HOSPITAL_COMMUNITY)
Admission: EM | Admit: 2013-09-08 | Discharge: 2013-09-08 | Disposition: A | Discharge status: Home / Self Care | Payer: No Typology Code available for payment source | Attending: Emergency Medicine | Admitting: Emergency Medicine

## 2013-09-08 ENCOUNTER — Encounter (HOSPITAL_COMMUNITY): Payer: Self-pay | Admitting: Emergency Medicine

## 2013-09-08 DIAGNOSIS — S81009A Unspecified open wound, unspecified knee, initial encounter: Secondary | ICD-10-CM | POA: Insufficient documentation

## 2013-09-08 DIAGNOSIS — S91009A Unspecified open wound, unspecified ankle, initial encounter: Principal | ICD-10-CM

## 2013-09-08 DIAGNOSIS — S81809A Unspecified open wound, unspecified lower leg, initial encounter: Secondary | ICD-10-CM | POA: Diagnosis present

## 2013-09-08 DIAGNOSIS — Z7982 Long term (current) use of aspirin: Secondary | ICD-10-CM | POA: Diagnosis not present

## 2013-09-08 DIAGNOSIS — F17200 Nicotine dependence, unspecified, uncomplicated: Secondary | ICD-10-CM | POA: Insufficient documentation

## 2013-09-08 DIAGNOSIS — Y9389 Activity, other specified: Secondary | ICD-10-CM | POA: Diagnosis not present

## 2013-09-08 DIAGNOSIS — M129 Arthropathy, unspecified: Secondary | ICD-10-CM | POA: Diagnosis not present

## 2013-09-08 DIAGNOSIS — Z91040 Latex allergy status: Secondary | ICD-10-CM | POA: Diagnosis not present

## 2013-09-08 DIAGNOSIS — Z23 Encounter for immunization: Secondary | ICD-10-CM | POA: Insufficient documentation

## 2013-09-08 DIAGNOSIS — Y9289 Other specified places as the place of occurrence of the external cause: Secondary | ICD-10-CM | POA: Insufficient documentation

## 2013-09-08 DIAGNOSIS — Z791 Long term (current) use of non-steroidal anti-inflammatories (NSAID): Secondary | ICD-10-CM | POA: Diagnosis not present

## 2013-09-08 DIAGNOSIS — IMO0002 Reserved for concepts with insufficient information to code with codable children: Secondary | ICD-10-CM | POA: Insufficient documentation

## 2013-09-08 DIAGNOSIS — Z79899 Other long term (current) drug therapy: Secondary | ICD-10-CM | POA: Insufficient documentation

## 2013-09-08 DIAGNOSIS — W2209XA Striking against other stationary object, initial encounter: Secondary | ICD-10-CM | POA: Insufficient documentation

## 2013-09-08 MED ORDER — HYDROCODONE-ACETAMINOPHEN 5-325 MG PO TABS: 1.0000 | ORAL_TABLET | ORAL | Status: AC | PRN

## 2013-09-08 MED ORDER — TETANUS-DIPHTH-ACELL PERTUSSIS 5-2.5-18.5 LF-MCG/0.5 IM SUSP
0.5000 mL | Freq: Once | INTRAMUSCULAR | Status: AC
  Administered 2013-09-08: 0.5 mL via INTRAMUSCULAR
  Filled 2013-09-08: qty 0.5

## 2013-09-08 MED ORDER — TETANUS-DIPHTH-ACELL PERTUSSIS 5-2.5-18.5 LF-MCG/0.5 IM SUSP: 0.5000 mL | Freq: Once | INTRAMUSCULAR | Status: DC

## 2013-09-08 MED ORDER — HYDROCODONE-ACETAMINOPHEN 5-325 MG PO TABS
1.0000 | ORAL_TABLET | Freq: Once | ORAL | Status: AC
  Administered 2013-09-08: 1 via ORAL
  Filled 2013-09-08: qty 1

## 2013-09-08 NOTE — ED Notes (Signed)
Pt has laceration to anterior aspect of right lower leg, appx 2.5 cm from scraping. Bleeding controlled. Pt in NAD. Unknown last tetanus.

## 2013-09-08 NOTE — ED Notes (Signed)
Patient was shopping at [employer] and stepped around a stand alone [place], cut the lower right leg.  Patient has an L shaped cut on lower shin.  Bleeding controlled.  New bandage applied with Telfa.

## 2013-09-08 NOTE — ED Provider Notes (Signed)
CSN: [id number]     Arrival date & time 09/08/13  2002 History  This chart was scribed for non-physician practitioner Arthor Captain, PA-C working with Hurman Horn, MD by Joaquin Music, ED Scribe. This patient was seen in room TR09C/TR09C and the patient's care was started at 9:39 PM .   Chief Complaint  Patient presents with  . Extremity Laceration   The history is provided by the patient. No language interpreter was used.   HPI Comments: Jenna Medina is a 63 y.o. female who presents to the Emergency Department complaining of R lower leg laceration that occurred 4 hours ago. Pt states while grocery shopping, bumped into a shelf, and cut her leg on the edge of a metal corner. States she normally has slight swelling to ankles. Hx of fussed R ankle. Denies taking blood thinners.   Past Medical History  Diagnosis Date  . Arthritis    Past Surgical History  Procedure Laterality Date  . Back surgery     No family history on file. History  Substance Use Topics  . Smoking status: Current Some Day Smoker    Types: Cigarettes  . Smokeless tobacco: Not on file  . Alcohol Use: No   OB History   Grav Para Term Preterm Abortions TAB SAB Ect Mult Living                 Review of Systems  Allergies  Motrin and Latex  Home Medications   Prior to Admission medications   Medication Sig Start Date End Date Taking? Authorizing Provider  alendronate (FOSAMAX) 70 MG tablet Take 70 mg by mouth once a week. Sunday.   Take with a full glass of water on an empty stomach.    Historical Provider, MD  aspirin EC 81 MG tablet Take 81 mg by mouth daily.    Historical Provider, MD  azithromycin (ZITHROMAX Z-PAK) 250 MG tablet Take 1 tablet (250 mg total) by mouth daily. 500mg  PO day 1, then 250mg  PO days 205 08/18/13   , PA-C  B Complex-C (B-COMPLEX WITH VITAMIN C) tablet Take 1 tablet by mouth daily.    Historical Provider, MD  baclofen (LIORESAL) 10 MG tablet Take 10 mg  by mouth 3 (three) times daily as needed for muscle spasms.  07/27/13   Historical Provider, MD  calcium carbonate (OS-CAL) 600 MG TABS tablet Take 600 mg by mouth daily with breakfast.    Historical Provider, MD  cholecalciferol (VITAMIN D) 400 UNITS TABS tablet Take 400 Units by mouth daily.    Historical Provider, MD  cyclobenzaprine (FLEXERIL) 10 MG tablet Take 10 mg by mouth 3 (three) times daily as needed for muscle spasms.    Historical Provider, MD  ENBREL SURECLICK 50 MG/ML injection Inject 50 mg as directed once a week. wednesday 07/27/13   Historical Provider, MD  esomeprazole (NEXIUM) 40 MG capsule Take 40 mg by mouth daily at 12 noon.    Historical Provider, MD  ferrous sulfate 325 (65 FE) MG tablet Take 325 mg by mouth daily with breakfast.    Historical Provider, MD  folic acid (FOLVITE) 1 MG tablet Take 1 mg by mouth daily.    Historical Provider, MD  HYDROmorphone HCl (EXALGO) 12 MG T24A SR tablet Take 12 mg by mouth daily.    Historical Provider, MD  lisinopril (PRINIVIL,ZESTRIL) 30 MG tablet Take 30 mg by mouth daily.  07/31/13   Historical Provider, MD  LYRICA 50 MG capsule Take 50 mg by  mouth 3 (three) times daily.  07/20/13   Historical Provider, MD  methotrexate (RHEUMATREX) 2.5 MG tablet Take 20 mg by mouth once a week. friday 07/18/13   Historical Provider, MD  Multiple Vitamin (MULTIVITAMIN WITH MINERALS) TABS tablet Take 1 tablet by mouth daily.    Historical Provider, MD  naproxen (NAPROSYN) 500 MG tablet Take 500 mg by mouth 2 (two) times daily with a meal.    Historical Provider, MD  oxyCODONE-acetaminophen (PERCOCET) 10-325 MG per tablet Take 1 tablet by mouth every 4 (four) hours as needed for pain.    Historical Provider, MD  oxyCODONE-acetaminophen (PERCOCET/ROXICET) 5-325 MG per tablet Take 1 tablet by mouth every 6 (six) hours as needed for severe pain. 08/04/13   Jamesetta Orleans Lawyer, PA-C  predniSONE (DELTASONE) 1 MG tablet Take 4 mg by mouth daily with breakfast. Take  with 5mg  tablet For a total of 9mg  07/05/13   Historical Provider, MD  predniSONE (DELTASONE) 5 MG tablet Take 5 mg by mouth daily with breakfast. Take with #4 - 1mg  tablets for a total of 9mg     Historical Provider, MD   BP 158/84  Pulse 93  Temp(Src) 98.1 F (36.7 C) (Oral)  Resp 16  Ht 5\' 1"  (1.549 m)  Wt 116 lb (52.617 kg)  BMI 21.93 kg/m2  SpO2 95%  Physical Exam  Nursing note and vitals reviewed. Constitutional: She is oriented to person, place, and time. She appears well-developed and well-nourished. No distress.  HENT:  Head: Normocephalic and atraumatic.  Eyes: EOM are normal.  Neck: Neck supple. No tracheal deviation present.  Cardiovascular: Normal rate and intact distal pulses.   Pulmonary/Chest: Effort normal. No respiratory distress.  Musculoskeletal: Normal range of motion.  1+ pitting edema to ankle bilaterally. Full strength and ROM in ankle. No foreign body palpated.   Neurological: She is alert and oriented to person, place, and time.  Skin: Skin is warm and dry.  4 cm U-shaped laceration over the R shin. Superficial. Bleeding controled.  Psychiatric: She has a normal mood and affect. Her behavior is normal.   ED Course  Procedures  DIAGNOSTIC STUDIES: Oxygen Saturation is 95% on RA, normal by my interpretation.    COORDINATION OF CARE: 9:44 PM-Discussed treatment plan which includes laceration repair. Pt agreed to plan.   9:48 PM- LACERATION REPAIR Performed by: , PA-C Consent: Verbal consent obtained. Risks and benefits: risks, benefits and alternatives were discussed Patient identity confirmed: provided demographic data Time out performed prior to procedure Prepped and Draped in normal sterile fashion Wound explored Laceration Location: R shin Laceration Length: 4 cm No Foreign Bodies seen or palpated Anesthesia: local infiltration Local anesthetic: lidocaine 2% with epinephrine Anesthetic total: 7 ml Irrigation method:  syringe Amount of cleaning: standard Skin closure: 5-0 Ethilon Number of sutures: 1 Technique: Corner stitch  Patient tolerance: Patient tolerated the procedure well with no immediate complications.  9:55 PM- LACERATION REPAIR Performed by: 09/04/13, PA-C Consent: Verbal consent obtained. Risks and benefits: risks, benefits and alternatives were discussed Patient identity confirmed: provided demographic data Time out performed prior to procedure Prepped and Draped in normal sterile fashion Wound explored Laceration Location: R shin Laceration Length: 4 cm No Foreign Bodies seen or palpated Anesthesia: local infiltration Local anesthetic: lidocaine 2% with epinephrine Anesthetic total: 7 ml Irrigation method: syringe Amount of cleaning: standard Skin closure: 5-0 Ethilon Number of sutures: 4 Technique: Simple interrupted  Patient tolerance: Patient tolerated the procedure well with no immediate complications.  10:07  PM-Informed pt to have sutures removed in 8-10 days. Encouraged pt to keep area clean and dry; Informed pt to watch for signs of infection. Will discharge pt with pain medication. Pt agreed to plan.  Labs Review Labs Reviewed - No data to display  Imaging Review No results found.   EKG Interpretation None     MDM   Final diagnoses:  Laceration    Tdap booster given.Pressure irrigation performed. Laceration occurred < 8 hours prior to repair which was well tolerated. Pt has no co morbidities to effect normal wound healing. Discussed suture home care w pt and answered questions. Pt to f-u for wound check and suture removal in 7 days. Pt is hemodynamically stable w no complaints prior to dc.     I personally performed the services described in this documentation, which was scribed in my presence. The recorded information has been reviewed and is accurate.    Arthor Captain, PA-C 09/08/13 2214

## 2013-09-08 NOTE — Discharge Instructions (Signed)
WOUND CARE Please have your stitches/staples removed in 10 days or sooner if you have concerns. You may do this at any available urgent care or at your primary care doctor's office.  Keep area clean and dry for 24 hours. Do not remove bandage, if applied.  After 24 hours, remove bandage and wash wound gently with mild soap and warm water. Reapply a new bandage after cleaning wound, if directed.  Continue daily cleansing with soap and water until stitches/staples are removed.  Do not apply any ointments or creams to the wound while stitches/staples are in place, as this may cause delayed healing.  Seek medical careif you experience any of the following signs of infection: Swelling, redness, pus drainage, streaking, fever >101.0 F  Seek care if you experience excessive bleeding that does not stop after 15-20 minutes of constant, firm pressure.   Laceration Care, Adult A laceration is a cut or lesion that goes through all layers of the skin and into the tissue just beneath the skin. TREATMENT  Some lacerations may not require closure. Some lacerations may not be able to be closed due to an increased risk of infection. It is important to see your caregiver as soon as possible after an injury to minimize the risk of infection and maximize the opportunity for successful closure. If closure is appropriate, pain medicines may be given, if needed. The wound will be cleaned to help prevent infection. Your caregiver will use stitches (sutures), staples, wound glue (adhesive), or skin adhesive strips to repair the laceration. These tools bring the skin edges together to allow for faster healing and a better cosmetic outcome. However, all wounds will heal with a scar. Once the wound has healed, scarring can be minimized by covering the wound with sunscreen during the day for 1 full year. HOME CARE INSTRUCTIONS  For sutures or staples:  Keep the wound clean and dry.  If you were given a bandage  (dressing), you should change it at least once a day. Also, change the dressing if it becomes wet or dirty, or as directed by your caregiver.  Wash the wound with soap and water 2 times a day. Rinse the wound off with water to remove all soap. Pat the wound dry with a clean towel.  After cleaning, apply a thin layer of the antibiotic ointment as recommended by your caregiver. This will help prevent infection and keep the dressing from sticking.  You may shower as usual after the first 24 hours. Do not soak the wound in water until the sutures are removed.  Only take over-the-counter or prescription medicines for pain, discomfort, or fever as directed by your caregiver.  Get your sutures or staples removed as directed by your caregiver. For skin adhesive strips:  Keep the wound clean and dry.  Do not get the skin adhesive strips wet. You may bathe carefully, using caution to keep the wound dry.  If the wound gets wet, pat it dry with a clean towel.  Skin adhesive strips will fall off on their own. You may trim the strips as the wound heals. Do not remove skin adhesive strips that are still stuck to the wound. They will fall off in time. For wound adhesive:  You may briefly wet your wound in the shower or bath. Do not soak or scrub the wound. Do not swim. Avoid periods of heavy perspiration until the skin adhesive has fallen off on its own. After showering or bathing, gently pat the wound dry with  a clean towel.  Do not apply liquid medicine, cream medicine, or ointment medicine to your wound while the skin adhesive is in place. This may loosen the film before your wound is healed.  If a dressing is placed over the wound, be careful not to apply tape directly over the skin adhesive. This may cause the adhesive to be pulled off before the wound is healed.  Avoid prolonged exposure to sunlight or tanning lamps while the skin adhesive is in place. Exposure to ultraviolet light in the first  year will darken the scar.  The skin adhesive will usually remain in place for 5 to 10 days, then naturally fall off the skin. Do not pick at the adhesive film. You may need a tetanus shot if:  You cannot remember when you had your last tetanus shot.  You have never had a tetanus shot. If you get a tetanus shot, your arm may swell, get red, and feel warm to the touch. This is common and not a problem. If you need a tetanus shot and you choose not to have one, there is a rare chance of getting tetanus. Sickness from tetanus can be serious. SEEK MEDICAL CARE IF:   You have redness, swelling, or increasing pain in the wound.  You see a red line that goes away from the wound.  You have yellowish-white fluid (pus) coming from the wound.  You have a fever.  You notice a bad smell coming from the wound or dressing.  Your wound breaks open before or after sutures have been removed.  You notice something coming out of the wound such as wood or glass.  Your wound is on your hand or foot and you cannot move a finger or toe. SEEK IMMEDIATE MEDICAL CARE IF:   Your pain is not controlled with prescribed medicine.  You have severe swelling around the wound causing pain and numbness or a change in color in your arm, hand, leg, or foot.  Your wound splits open and starts bleeding.  You have worsening numbness, weakness, or loss of function of any joint around or beyond the wound.  You develop painful lumps near the wound or on the skin anywhere on your body. MAKE SURE YOU:   Understand these instructions.  Will watch your condition.  Will get help right away if you are not doing well or get worse. Document Released: 03/23/2005 Document Revised: 06/15/2011 Document Reviewed: 09/16/2010 [facility] Patient Information 2014 [place], [place].

## 2013-09-09 NOTE — ED Provider Notes (Signed)
Medical screening examination/treatment/procedure(s) were performed by non-physician practitioner and as supervising physician I was immediately available for consultation/collaboration.   EKG Interpretation None       Hurman Horn, MD 09/09/13 1321

## 2013-09-22 ENCOUNTER — Emergency Department (HOSPITAL_COMMUNITY)
Admission: EM | Admit: 2013-09-22 | Discharge: 2013-09-22 | Disposition: A | Discharge status: Home / Self Care | Payer: PRIVATE HEALTH INSURANCE | Attending: Emergency Medicine | Admitting: Emergency Medicine

## 2013-09-22 DIAGNOSIS — Z7982 Long term (current) use of aspirin: Secondary | ICD-10-CM | POA: Insufficient documentation

## 2013-09-22 DIAGNOSIS — M129 Arthropathy, unspecified: Secondary | ICD-10-CM | POA: Diagnosis not present

## 2013-09-22 DIAGNOSIS — Z4802 Encounter for removal of sutures: Secondary | ICD-10-CM | POA: Insufficient documentation

## 2013-09-22 DIAGNOSIS — Z791 Long term (current) use of non-steroidal anti-inflammatories (NSAID): Secondary | ICD-10-CM | POA: Diagnosis not present

## 2013-09-22 DIAGNOSIS — IMO0002 Reserved for concepts with insufficient information to code with codable children: Secondary | ICD-10-CM | POA: Diagnosis not present

## 2013-09-22 DIAGNOSIS — Z91040 Latex allergy status: Secondary | ICD-10-CM | POA: Diagnosis not present

## 2013-09-22 DIAGNOSIS — Z792 Long term (current) use of antibiotics: Secondary | ICD-10-CM | POA: Diagnosis not present

## 2013-09-22 DIAGNOSIS — F17200 Nicotine dependence, unspecified, uncomplicated: Secondary | ICD-10-CM | POA: Insufficient documentation

## 2013-09-22 NOTE — ED Provider Notes (Signed)
CSN: [id number]     Arrival date & time 09/22/13  1232 History  This chart was scribed for non-physician practitioner, Clinton Sawyer, working with Rolland Porter, MD, by Jarvis Morgan, ED Scribe. This patient was seen in room TR10C/TR10C and the patient's care was started at 1:01 PM.    Chief Complaint  Patient presents with  . Suture / Staple Removal      The history is provided by the patient. No language interpreter was used.   HPI Comments: Jenna Medina is a 63 y.o. female who presents to the Emergency Department for a suture removal. Patient had a laceration to her right leg that was sutured on 09/08/13. She states she cut her leg while she was grocery shopping. Patient reports associated minimal clear drainage from the suture site. She denies any fever or chills.    Past Medical History  Diagnosis Date  . Arthritis    Past Surgical History  Procedure Laterality Date  . Back surgery     No family history on file. History  Substance Use Topics  . Smoking status: Current Some Day Smoker    Types: Cigarettes  . Smokeless tobacco: Not on file  . Alcohol Use: No   OB History   Grav Para Term Preterm Abortions TAB SAB Ect Mult Living                 Review of Systems  Skin: Positive for wound.       5 sutures to her right leg, needs removal. Mild clear drainage noted  A complete 10 system review of systems was obtained and all systems are negative except as noted in the HPI and PMH.      Allergies  Motrin and Latex  Home Medications   Prior to Admission medications   Medication Sig Start Date End Date Taking? Authorizing Provider  alendronate (FOSAMAX) 70 MG tablet Take 70 mg by mouth once a week. Sunday.   Take with a full glass of water on an empty stomach.    Historical Provider, MD  aspirin EC 81 MG tablet Take 81 mg by mouth daily.    Historical Provider, MD  azithromycin (ZITHROMAX Z-PAK) 250 MG tablet Take 1 tablet (250 mg total) by mouth daily. 500mg  PO  day 1, then 250mg  PO days 205 08/18/13   , PA-C  B Complex-C (B-COMPLEX WITH VITAMIN C) tablet Take 1 tablet by mouth daily.    Historical Provider, MD  baclofen (LIORESAL) 10 MG tablet Take 10 mg by mouth 3 (three) times daily as needed for muscle spasms.  07/27/13   Historical Provider, MD  calcium carbonate (OS-CAL) 600 MG TABS tablet Take 600 mg by mouth daily with breakfast.    Historical Provider, MD  cholecalciferol (VITAMIN D) 400 UNITS TABS tablet Take 400 Units by mouth daily.    Historical Provider, MD  cyclobenzaprine (FLEXERIL) 10 MG tablet Take 10 mg by mouth 3 (three) times daily as needed for muscle spasms.    Historical Provider, MD  ENBREL SURECLICK 50 MG/ML injection Inject 50 mg as directed once a week. wednesday 07/27/13   Historical Provider, MD  esomeprazole (NEXIUM) 40 MG capsule Take 40 mg by mouth daily at 12 noon.    Historical Provider, MD  ferrous sulfate 325 (65 FE) MG tablet Take 325 mg by mouth daily with breakfast.    Historical Provider, MD  folic acid (FOLVITE) 1 MG tablet Take 1 mg by mouth daily.    Historical  Provider, MD  HYDROcodone-acetaminophen (NORCO) 5-325 MG per tablet Take 1 tablet by mouth every 4 (four) hours as needed. 09/08/13   Arthor Captain, PA-C  HYDROmorphone HCl (EXALGO) 12 MG T24A SR tablet Take 12 mg by mouth daily.    Historical Provider, MD  lisinopril (PRINIVIL,ZESTRIL) 30 MG tablet Take 30 mg by mouth daily.  07/31/13   Historical Provider, MD  LYRICA 50 MG capsule Take 50 mg by mouth 3 (three) times daily.  07/20/13   Historical Provider, MD  methotrexate (RHEUMATREX) 2.5 MG tablet Take 20 mg by mouth once a week. friday 07/18/13   Historical Provider, MD  Multiple Vitamin (MULTIVITAMIN WITH MINERALS) TABS tablet Take 1 tablet by mouth daily.    Historical Provider, MD  naproxen (NAPROSYN) 500 MG tablet Take 500 mg by mouth 2 (two) times daily with a meal.    Historical Provider, MD  oxyCODONE-acetaminophen (PERCOCET) 10-325 MG  per tablet Take 1 tablet by mouth every 4 (four) hours as needed for pain.    Historical Provider, MD  oxyCODONE-acetaminophen (PERCOCET/ROXICET) 5-325 MG per tablet Take 1 tablet by mouth every 6 (six) hours as needed for severe pain. 08/04/13   Jamesetta Orleans Lawyer, PA-C  predniSONE (DELTASONE) 1 MG tablet Take 4 mg by mouth daily with breakfast. Take with 5mg  tablet For a total of 9mg  07/05/13   Historical Provider, MD  predniSONE (DELTASONE) 5 MG tablet Take 5 mg by mouth daily with breakfast. Take with #4 - 1mg  tablets for a total of 9mg     Historical Provider, MD   Triage Vitals: BP 157/88  Pulse 68  Temp(Src) 97.1 F (36.2 C) (Oral)  Resp 16  Ht 5\' 1"  (1.549 m)  Wt 113 lb (51.256 kg)  BMI 21.36 kg/m2  SpO2 99%  Physical Exam  Nursing note and vitals reviewed. Constitutional: She is oriented to person, place, and time. She appears well-developed and well-nourished. No distress.  HENT:  Head: Normocephalic and atraumatic.  Mouth/Throat: Oropharynx is clear and moist.  Eyes: Conjunctivae and EOM are normal.  Neck: Normal range of motion. Neck supple.  Cardiovascular: Normal rate, regular rhythm and normal heart sounds.   Pulmonary/Chest: Effort normal and breath sounds normal. No respiratory distress.  Musculoskeletal: Normal range of motion. She exhibits no edema.  Neurological: She is alert and oriented to person, place, and time. No sensory deficit.  Skin: Skin is warm and dry.  Well healing U-shaped laceration with 5 sutures intact over anterior right lower leg. No surrounding erythema or signs of infection.  Psychiatric: She has a normal mood and affect. Her behavior is normal.    ED Course  Procedures (including critical care time)  DIAGNOSTIC STUDIES: Oxygen Saturation is 99% on RA, normal by my interpretation.    COORDINATION OF CARE: 1:05 PM  SUTURE REMOVAL Performed by: , 09/04/13 student, , Consent: Verbal consent obtained. Patient  identity confirmed: provided demographic data Time out: Immediately prior to procedure a "time out" was called to verify the correct patient, procedure, equipment, support staff and site/side marked as required. Location: right lower leg Wound Appearance: clean Sutures/Staples Removed: 5 Patient tolerance: Patient tolerated the procedure well with no immediate complications.      Labs Review Labs Reviewed - No data to display  Imaging Review No results found.   EKG Interpretation None      MDM   Final diagnoses:  Visit for suture removal   5 sutures removed, no signs of infection. Well-healing wound.  Stable for discharge. Followup with PCP. Return precautions given. Patient states understanding of treatment care plan and is agreeable.   I personally performed the services described in this documentation, which was scribed in my presence. The recorded information has been reviewed and is accurate.    Trevor Mace, PA-C 09/22/13 1315

## 2013-09-22 NOTE — Discharge Instructions (Signed)
Followup with your primary care physician next week for recheck.  Suture Removal, Care After Refer to this sheet in the next few weeks. These instructions provide you with information on caring for yourself after your procedure. Your health care provider may also give you more specific instructions. Your treatment has been planned according to current medical practices, but problems sometimes occur. Call your health care provider if you have any problems or questions after your procedure. WHAT TO EXPECT AFTER THE PROCEDURE After your stitches (sutures) are removed, it is typical to have the following:  Some discomfort and swelling in the wound area.  Slight redness in the area. HOME CARE INSTRUCTIONS   If you have skin adhesive strips over the wound area, do not take the strips off. They will fall off on their own in a few days. If the strips remain in place after 14 days, you may remove them.  Change any bandages (dressings) at least once a day or as directed by your health care provider. If the bandage sticks, soak it off with warm, soapy water.  Apply cream or ointment only as directed by your health care provider. If using cream or ointment, wash the area with soap and water 2 times a day to remove all the cream or ointment. Rinse off the soap and pat the area dry with a clean towel.  Keep the wound area dry and clean. If the bandage becomes wet or dirty, or if it develops a bad smell, change it as soon as possible.  Continue to protect the wound from injury.  Use sunscreen when out in the sun. New scars become sunburned easily. SEEK MEDICAL CARE IF:  You have increasing redness, swelling, or pain in the wound.  You see pus coming from the wound.  You have a fever.  You notice a bad smell coming from the wound or dressing.  Your wound breaks open (edges not staying together). Document Released: 12/16/2000 Document Revised: 01/11/2013 Document Reviewed: 11/02/2012 [facility]  Patient Information 2015 [place], [place]. This information is not intended to replace advice given to you by your health care provider. Make sure you discuss any questions you have with your health care provider.  Scar Minimization You will have a scar anytime you have surgery and a cut is made in the skin or you have something removed from your skin (mole, skin cancer, cyst). Although scars are unavoidable following surgery, there are ways to minimize their appearance. It is important to follow all the instructions you receive from your caregiver about wound care. How your wound heals will influence the appearance of your scar. If you do not follow the wound care instructions as directed, complications such as infection may occur. Wound instructions include keeping the wound clean, moist, and not letting the wound form a scab. Some people form scars that are raised and lumpy (hypertrophic) or larger than the initial wound (keloidal). HOME CARE INSTRUCTIONS   Follow wound care instructions as directed.  Keep the wound clean by washing it with soap and water.  Keep the wound moist with provided antibiotic cream or petroleum jelly until completely healed. Moisten twice a day for about 2 weeks.  Get stitches (sutures) taken out at the scheduled time.  Avoid touching or manipulating your wound unless needed. Wash your hands thoroughly before and after touching your wound.  Follow all restrictions such as limits on exercise or work. This depends on where your scar is located.  Keep the scar protected from sunburn.  Cover the scar with sunscreen/sunblock with SPF 30 or higher.  Gently massage the scar using a circular motion to help minimize the appearance of the scar. Do this only after the wound has closed and all the sutures have been removed.  For hypertrophic or keloidal scars, there are several ways to treat and minimize their appearance. Methods include compression therapy, intralesional  corticosteroids, laser therapy, or surgery. These methods are performed by your caregiver. Remember that the scar may appear lighter or darker than your normal skin color. This difference in color should even out with time. SEEK MEDICAL CARE IF:   You have a fever.  You develop signs of infection such as pain, redness, pus, and warmth.  You have questions or concerns. Document Released: 09/10/2009 Document Revised: 06/15/2011 Document Reviewed: 09/10/2009 [facility] Patient Information 2015 [place], [place]. This information is not intended to replace advice given to you by your health care provider. Make sure you discuss any questions you have with your health care provider.

## 2013-09-22 NOTE — ED Notes (Signed)
Pt had a laceration that was sutured on 09/08/13, comes in for removal of sutures.

## 2013-09-27 NOTE — ED Provider Notes (Signed)
Medical screening examination/treatment/procedure(s) were conducted as a shared visit with non-physician practitioner(s) and myself.  I personally evaluated the patient during the encounter.   EKG Interpretation None        Rolland Porter, MD 09/27/13 [phone]

## 2013-12-28 ENCOUNTER — Other Ambulatory Visit: Payer: Self-pay | Admitting: Orthopedic Surgery

## 2014-01-12 ENCOUNTER — Encounter (HOSPITAL_BASED_OUTPATIENT_CLINIC_OR_DEPARTMENT_OTHER): Payer: Self-pay | Admitting: *Deleted

## 2014-01-12 ENCOUNTER — Other Ambulatory Visit: Payer: Self-pay | Admitting: Orthopedic Surgery

## 2014-01-12 NOTE — Progress Notes (Signed)
To come in for ekg-bmet 

## 2014-01-17 ENCOUNTER — Ambulatory Visit (HOSPITAL_BASED_OUTPATIENT_CLINIC_OR_DEPARTMENT_OTHER)
Admission: RE | Admit: 2014-01-17 | Discharge: 2014-01-17 | Disposition: A | Discharge status: Home / Self Care | Payer: PRIVATE HEALTH INSURANCE | Source: Ambulatory Visit | Attending: Orthopedic Surgery | Admitting: Orthopedic Surgery

## 2014-01-17 DIAGNOSIS — G5601 Carpal tunnel syndrome, right upper limb: Secondary | ICD-10-CM | POA: Diagnosis not present

## 2014-01-17 DIAGNOSIS — M069 Rheumatoid arthritis, unspecified: Secondary | ICD-10-CM | POA: Diagnosis not present

## 2014-01-17 DIAGNOSIS — G5621 Lesion of ulnar nerve, right upper limb: Secondary | ICD-10-CM | POA: Insufficient documentation

## 2014-01-17 DIAGNOSIS — Z79899 Other long term (current) drug therapy: Secondary | ICD-10-CM | POA: Diagnosis not present

## 2014-01-17 DIAGNOSIS — F17210 Nicotine dependence, cigarettes, uncomplicated: Secondary | ICD-10-CM | POA: Diagnosis not present

## 2014-01-17 DIAGNOSIS — K219 Gastro-esophageal reflux disease without esophagitis: Secondary | ICD-10-CM | POA: Diagnosis not present

## 2014-01-17 DIAGNOSIS — I10 Essential (primary) hypertension: Secondary | ICD-10-CM | POA: Diagnosis not present

## 2014-01-17 DIAGNOSIS — Z7952 Long term (current) use of systemic steroids: Secondary | ICD-10-CM | POA: Diagnosis not present

## 2014-01-17 DIAGNOSIS — Z886 Allergy status to analgesic agent status: Secondary | ICD-10-CM | POA: Diagnosis not present

## 2014-01-17 DIAGNOSIS — Z91040 Latex allergy status: Secondary | ICD-10-CM | POA: Diagnosis not present

## 2014-01-17 LAB — POCT I-STAT, CHEM 8
BUN: 15 mg/dL (ref 6–23)
CREATININE: 0.8 mg/dL (ref 0.50–1.10)
Calcium, Ion: 1 mmol/L — ABNORMAL LOW (ref 1.13–1.30)
Chloride: 110 mEq/L (ref 96–112)
Glucose, Bld: 89 mg/dL (ref 70–99)
HCT: 40 % (ref 36.0–46.0)
Hemoglobin: 13.6 g/dL (ref 12.0–15.0)
Potassium: 3.8 mEq/L (ref 3.7–5.3)
SODIUM: 142 meq/L (ref 137–147)
TCO2: 25 mmol/L (ref 0–100)

## 2014-01-18 ENCOUNTER — Ambulatory Visit (HOSPITAL_BASED_OUTPATIENT_CLINIC_OR_DEPARTMENT_OTHER): Payer: PRIVATE HEALTH INSURANCE | Admitting: Anesthesiology

## 2014-01-18 ENCOUNTER — Encounter (HOSPITAL_BASED_OUTPATIENT_CLINIC_OR_DEPARTMENT_OTHER)
Admission: RE | Disposition: A | Discharge status: Home / Self Care | Payer: Self-pay | Source: Ambulatory Visit | Attending: Orthopedic Surgery

## 2014-01-18 ENCOUNTER — Ambulatory Visit (HOSPITAL_BASED_OUTPATIENT_CLINIC_OR_DEPARTMENT_OTHER)
Admission: RE | Admit: 2014-01-18 | Discharge: 2014-01-18 | Disposition: A | Discharge status: Home / Self Care | Payer: PRIVATE HEALTH INSURANCE | Source: Ambulatory Visit | Attending: Orthopedic Surgery | Admitting: Orthopedic Surgery

## 2014-01-18 ENCOUNTER — Encounter (HOSPITAL_BASED_OUTPATIENT_CLINIC_OR_DEPARTMENT_OTHER): Payer: PRIVATE HEALTH INSURANCE | Admitting: Anesthesiology

## 2014-01-18 ENCOUNTER — Encounter (HOSPITAL_BASED_OUTPATIENT_CLINIC_OR_DEPARTMENT_OTHER): Payer: Self-pay | Admitting: Orthopedic Surgery

## 2014-01-18 DIAGNOSIS — Z7952 Long term (current) use of systemic steroids: Secondary | ICD-10-CM | POA: Insufficient documentation

## 2014-01-18 DIAGNOSIS — G5601 Carpal tunnel syndrome, right upper limb: Secondary | ICD-10-CM | POA: Diagnosis not present

## 2014-01-18 DIAGNOSIS — G5621 Lesion of ulnar nerve, right upper limb: Secondary | ICD-10-CM | POA: Diagnosis not present

## 2014-01-18 DIAGNOSIS — Z91040 Latex allergy status: Secondary | ICD-10-CM | POA: Insufficient documentation

## 2014-01-18 DIAGNOSIS — M069 Rheumatoid arthritis, unspecified: Secondary | ICD-10-CM | POA: Insufficient documentation

## 2014-01-18 DIAGNOSIS — I10 Essential (primary) hypertension: Secondary | ICD-10-CM | POA: Insufficient documentation

## 2014-01-18 DIAGNOSIS — K219 Gastro-esophageal reflux disease without esophagitis: Secondary | ICD-10-CM | POA: Insufficient documentation

## 2014-01-18 DIAGNOSIS — Z886 Allergy status to analgesic agent status: Secondary | ICD-10-CM | POA: Insufficient documentation

## 2014-01-18 DIAGNOSIS — F17210 Nicotine dependence, cigarettes, uncomplicated: Secondary | ICD-10-CM | POA: Insufficient documentation

## 2014-01-18 DIAGNOSIS — Z79899 Other long term (current) drug therapy: Secondary | ICD-10-CM | POA: Insufficient documentation

## 2014-01-18 HISTORY — DX: Other complications of anesthesia, initial encounter: T88.59XA

## 2014-01-18 HISTORY — PX: ULNAR NERVE TRANSPOSITION: SHX2595

## 2014-01-18 HISTORY — PX: TENOSYNOVECTOMY: SHX6110

## 2014-01-18 HISTORY — DX: Adverse effect of unspecified anesthetic, initial encounter: T41.45XA

## 2014-01-18 HISTORY — DX: Presence of dental prosthetic device (complete) (partial): Z97.2

## 2014-01-18 HISTORY — DX: Gastro-esophageal reflux disease without esophagitis: K21.9

## 2014-01-18 HISTORY — DX: Essential (primary) hypertension: I10

## 2014-01-18 HISTORY — DX: Presence of spectacles and contact lenses: Z97.3

## 2014-01-18 LAB — POCT HEMOGLOBIN-HEMACUE: HEMOGLOBIN: 12.4 g/dL (ref 12.0–15.0)

## 2014-01-18 SURGERY — ULNAR NERVE DECOMPRESSION/TRANSPOSITION
Anesthesia: General | Site: Wrist | Laterality: Right

## 2014-01-18 MED ORDER — CEFAZOLIN SODIUM-DEXTROSE 2-3 GM-% IV SOLR
2.0000 g | INTRAVENOUS | Status: AC
  Administered 2014-01-18: 2 g via INTRAVENOUS

## 2014-01-18 MED ORDER — CEFAZOLIN SODIUM-DEXTROSE 2-3 GM-% IV SOLR: 2.0000 g | INTRAVENOUS | Status: DC

## 2014-01-18 MED ORDER — MIDAZOLAM HCL 2 MG/2ML IJ SOLN
1.0000 mg | INTRAMUSCULAR | Status: DC | PRN
  Administered 2014-01-18: 2 mg via INTRAVENOUS

## 2014-01-18 MED ORDER — FENTANYL CITRATE 0.05 MG/ML IJ SOLN
INTRAMUSCULAR | Status: AC
  Filled 2014-01-18: qty 2

## 2014-01-18 MED ORDER — MIDAZOLAM HCL 2 MG/2ML IJ SOLN
INTRAMUSCULAR | Status: AC
  Filled 2014-01-18: qty 2

## 2014-01-18 MED ORDER — OXYCODONE HCL 5 MG PO TABS: 5.0000 mg | ORAL_TABLET | Freq: Once | ORAL | Status: DC | PRN

## 2014-01-18 MED ORDER — PROPOFOL 10 MG/ML IV BOLUS
INTRAVENOUS | Status: AC
  Filled 2014-01-18: qty 20

## 2014-01-18 MED ORDER — OXYCODONE-ACETAMINOPHEN 10-325 MG PO TABS: 1.0000 | ORAL_TABLET | ORAL | Status: AC | PRN

## 2014-01-18 MED ORDER — LACTATED RINGERS IV SOLN
INTRAVENOUS | Status: DC
  Administered 2014-01-18 (×2): via INTRAVENOUS

## 2014-01-18 MED ORDER — BUPIVACAINE-EPINEPHRINE (PF) 0.5% -1:200000 IJ SOLN
INTRAMUSCULAR | Status: DC | PRN
  Administered 2014-01-18: 25 mL via PERINEURAL

## 2014-01-18 MED ORDER — CEPHALEXIN 250 MG PO CAPS: 250.0000 mg | ORAL_CAPSULE | Freq: Three times a day (TID) | ORAL | Status: AC

## 2014-01-18 MED ORDER — ONDANSETRON HCL 4 MG/2ML IJ SOLN
INTRAMUSCULAR | Status: DC | PRN
  Administered 2014-01-18: 4 mg via INTRAVENOUS

## 2014-01-18 MED ORDER — CEFAZOLIN SODIUM-DEXTROSE 2-3 GM-% IV SOLR
INTRAVENOUS | Status: AC
  Filled 2014-01-18: qty 50

## 2014-01-18 MED ORDER — FENTANYL CITRATE 0.05 MG/ML IJ SOLN
50.0000 ug | INTRAMUSCULAR | Status: DC | PRN
  Administered 2014-01-18: 50 ug via INTRAVENOUS

## 2014-01-18 MED ORDER — LIDOCAINE HCL (CARDIAC) 20 MG/ML IV SOLN
INTRAVENOUS | Status: DC | PRN
  Administered 2014-01-18: 50 mg via INTRAVENOUS

## 2014-01-18 MED ORDER — PROPOFOL 10 MG/ML IV BOLUS
INTRAVENOUS | Status: DC | PRN
  Administered 2014-01-18: 100 mg via INTRAVENOUS

## 2014-01-18 MED ORDER — FENTANYL CITRATE 0.05 MG/ML IJ SOLN
INTRAMUSCULAR | Status: AC
  Filled 2014-01-18: qty 4

## 2014-01-18 MED ORDER — CHLORHEXIDINE GLUCONATE 4 % EX LIQD: 60.0000 mL | Freq: Once | CUTANEOUS | Status: DC

## 2014-01-18 MED ORDER — OXYCODONE HCL 5 MG/5ML PO SOLN: 5.0000 mg | Freq: Once | ORAL | Status: DC | PRN

## 2014-01-18 MED ORDER — HYDROMORPHONE HCL 1 MG/ML IJ SOLN: 0.2500 mg | INTRAMUSCULAR | Status: DC | PRN

## 2014-01-18 MED ORDER — ONDANSETRON HCL 4 MG/2ML IJ SOLN: 4.0000 mg | Freq: Once | INTRAMUSCULAR | Status: DC | PRN

## 2014-01-18 SURGICAL SUPPLY — 76 items
BAG DECANTER FOR FLEXI CONT (MISCELLANEOUS) IMPLANT
BANDAGE COBAN STERILE 2 (GAUZE/BANDAGES/DRESSINGS) ×2 IMPLANT
BLADE MINI RND TIP GREEN BEAV (BLADE) IMPLANT
BLADE SURG 15 STRL LF DISP TIS (BLADE) ×2 IMPLANT
BLADE SURG 15 STRL SS (BLADE) ×4
BNDG CMPR 9X4 STRL LF SNTH (GAUZE/BANDAGES/DRESSINGS) ×2
BNDG COHESIVE 3X5 TAN STRL LF (GAUZE/BANDAGES/DRESSINGS) ×4 IMPLANT
BNDG ESMARK 4X9 LF (GAUZE/BANDAGES/DRESSINGS) ×4 IMPLANT
BNDG GAUZE ELAST 4 BULKY (GAUZE/BANDAGES/DRESSINGS) ×4 IMPLANT
CHLORAPREP W/TINT 26ML (MISCELLANEOUS) ×4 IMPLANT
CORDS BIPOLAR (ELECTRODE) ×4 IMPLANT
COVER BACK TABLE 60X90IN (DRAPES) ×4 IMPLANT
COVER MAYO STAND STRL (DRAPES) ×4 IMPLANT
CUFF TOURNIQUET SINGLE 18IN (TOURNIQUET CUFF) ×4 IMPLANT
DECANTER SPIKE VIAL GLASS SM (MISCELLANEOUS) IMPLANT
DRAIN TLS ROUND 10FR (DRAIN) IMPLANT
DRAPE EXTREMITY TIBURON (DRAPES) ×4 IMPLANT
DRAPE SURG 17X23 STRL (DRAPES) ×4 IMPLANT
DRSG PAD ABDOMINAL 8X10 ST (GAUZE/BANDAGES/DRESSINGS) ×4 IMPLANT
GAUZE SPONGE 4X4 12PLY STRL (GAUZE/BANDAGES/DRESSINGS) ×4 IMPLANT
GAUZE SPONGE 4X4 16PLY XRAY LF (GAUZE/BANDAGES/DRESSINGS) IMPLANT
GAUZE XEROFORM 1X8 LF (GAUZE/BANDAGES/DRESSINGS) ×4 IMPLANT
GLOVE BIOGEL PI IND STRL 7.0 (GLOVE) IMPLANT
GLOVE BIOGEL PI IND STRL 8.5 (GLOVE) ×2 IMPLANT
GLOVE BIOGEL PI INDICATOR 7.0 (GLOVE) ×2
GLOVE BIOGEL PI INDICATOR 8.5 (GLOVE) ×2
GLOVE SURG ORTHO 8.0 STRL STRW (GLOVE) ×4 IMPLANT
GLOVE SURG SS PI 6.5 STRL IVOR (GLOVE) ×2 IMPLANT
GLOVE SURG SS PI 8.0 STRL IVOR (GLOVE) ×2 IMPLANT
GOWN STRL REUS W/ TWL LRG LVL3 (GOWN DISPOSABLE) ×2 IMPLANT
GOWN STRL REUS W/TWL LRG LVL3 (GOWN DISPOSABLE) ×4
GOWN STRL REUS W/TWL XL LVL3 (GOWN DISPOSABLE) ×4 IMPLANT
K-WIRE .035X4 (WIRE) IMPLANT
LOOP VESSEL MAXI BLUE (MISCELLANEOUS) IMPLANT
NDL KEITH (NEEDLE) IMPLANT
NEEDLE 27GAX1X1/2 (NEEDLE) IMPLANT
NEEDLE HYPO 22GX1.5 SAFETY (NEEDLE) IMPLANT
NEEDLE KEITH (NEEDLE) IMPLANT
NS IRRIG 1000ML POUR BTL (IV SOLUTION) ×4 IMPLANT
PACK BASIN DAY SURGERY FS (CUSTOM PROCEDURE TRAY) ×4 IMPLANT
PAD CAST 3X4 CTTN HI CHSV (CAST SUPPLIES) ×2 IMPLANT
PAD CAST 4YDX4 CTTN HI CHSV (CAST SUPPLIES) ×2 IMPLANT
PADDING CAST ABS 3INX4YD NS (CAST SUPPLIES)
PADDING CAST ABS 4INX4YD NS (CAST SUPPLIES) ×2
PADDING CAST ABS COTTON 3X4 (CAST SUPPLIES) IMPLANT
PADDING CAST ABS COTTON 4X4 ST (CAST SUPPLIES) ×2 IMPLANT
PADDING CAST COTTON 3X4 STRL (CAST SUPPLIES) ×4
PADDING CAST COTTON 4X4 STRL (CAST SUPPLIES) ×4
SLEEVE SCD COMPRESS KNEE MED (MISCELLANEOUS) ×4 IMPLANT
SPLINT PLASTER CAST XFAST 3X15 (CAST SUPPLIES) IMPLANT
SPLINT PLASTER XTRA FASTSET 3X (CAST SUPPLIES)
SPONGE GAUZE 4X4 12PLY STER LF (GAUZE/BANDAGES/DRESSINGS) ×2 IMPLANT
STOCKINETTE 4X48 STRL (DRAPES) ×4 IMPLANT
SUT ETHIBOND 3-0 V-5 (SUTURE) IMPLANT
SUT MERSILENE 2.0 SH NDLE (SUTURE) IMPLANT
SUT MERSILENE 3 0 FS 1 (SUTURE) IMPLANT
SUT MERSILENE 4 0 P 3 (SUTURE) IMPLANT
SUT PROLENE 2 0 SH DA (SUTURE) IMPLANT
SUT SILK 4 0 PS 2 (SUTURE) IMPLANT
SUT STEEL 4 0 V 26 (SUTURE) IMPLANT
SUT VIC AB 2-0 SH 27 (SUTURE) ×4
SUT VIC AB 2-0 SH 27XBRD (SUTURE) ×2 IMPLANT
SUT VIC AB 3-0 PS1 18 (SUTURE)
SUT VIC AB 3-0 PS1 18XBRD (SUTURE) IMPLANT
SUT VIC AB 4-0 P-3 18XBRD (SUTURE) IMPLANT
SUT VIC AB 4-0 P3 18 (SUTURE)
SUT VICRYL 4-0 PS2 18IN ABS (SUTURE) ×4 IMPLANT
SUT VICRYL RAPID 5 0 P 3 (SUTURE) IMPLANT
SUT VICRYL RAPIDE 4/0 PS 2 (SUTURE) ×4 IMPLANT
SWAB COLLECTION DEVICE MRSA (MISCELLANEOUS) IMPLANT
SYR BULB 3OZ (MISCELLANEOUS) ×4 IMPLANT
SYR CONTROL 10ML LL (SYRINGE) ×4 IMPLANT
TOWEL OR 17X24 6PK STRL BLUE (TOWEL DISPOSABLE) ×8 IMPLANT
TUBE ANAEROBIC SPECIMEN COL (MISCELLANEOUS) IMPLANT
TUBE FEEDING 5FR 15 INCH (TUBING) IMPLANT
UNDERPAD 30X30 INCONTINENT (UNDERPADS AND DIAPERS) ×4 IMPLANT

## 2014-01-18 NOTE — Brief Op Note (Signed)
01/18/2014  3:02 PM  PATIENT:  Katherina Mires  63 y.o. female  PRE-OPERATIVE DIAGNOSIS:  RIGHT CARPAL TUNNEL SYNDROME  POST-OPERATIVE DIAGNOSIS:  RIGHT CARPAL TUNNEL SYNDROME  PROCEDURE:  Procedure(s): DECOMPRESSION/POSSIBLE TRANSPOSITION ULNAR NERVE RIGHT (Right) TENOSYNOVECTOMY FLEXORS RIGHT WRIST (Right)  SURGEON:  Surgeon(s) and Role:    * Cindee Salt, MD - Primary  PHYSICIAN ASSISTANT:   ASSISTANTS: none   ANESTHESIA:   regional and general  EBL:  Total I/O In: 1300 [I.V.:1300] Out: -   BLOOD ADMINISTERED:none  DRAINS: none   LOCAL MEDICATIONS USED:  NONE  SPECIMEN:  Excision  DISPOSITION OF SPECIMEN:  PATHOLOGY  COUNTS:  YES  TOURNIQUET:   Total Tourniquet Time Documented: Upper Arm (Right) - 55 minutes Total: Upper Arm (Right) - 55 minutes   DICTATION: .Other Dictation: Dictation Number [id number]  PLAN OF CARE: Discharge to home after PACU  PATIENT DISPOSITION:  PACU - hemodynamically stable.

## 2014-01-18 NOTE — H&P (Signed)
Jenna Medina returns today. She has not been seen in 6 years. She is complaining of constant numbness and tingling both hands, discomfort in her right wrist. All fingers are numb on a constant basis including the small finger. She is complaining of decreased grip strength. This does not awaken her at night. There is no history of injury. She has undergone cervical partial fusion. She has had an MRI done revealing continued osteophyte formation with an interbody fusion and plating at C3/4 with incomplete healing. There is posterior osteophyte formation at C4/5 with bulging, bilateral uncinate spurring worse on the right, mild stenosis without cord compression, bilateral C5 root encroachment is likely right worse than left with encroachment at C4/5, 5/6, 6/7 and T1 bilaterally. These were done in February and read by Dr. Benard Rink. She has had nerve conductions done by Dr. Murray Hodgkins on 05-26-13 revealing bilateral changes of the median nerve, severe on the left, mild to moderate on the right without any proximal evidence of compression. She has a right ulnar neuropathy at the elbow with no changes distally. She is s/p transposition on her left side with continued symptoms and changes which are mild to the ulnar nerve with no changes distally. She has undergone a cervical fusion by Dr. Wynetta Emery and transposition by Dr. Wynetta Emery approximately 4-5 years ago. She has a history of rheumatoid arthritis. She has no history of thyroid problems, arthritis or gout. She is treated by Dr. Dierdre Forth for her rheumatoid arthritis. She fell several weeks ago fracturing her humerus. She was treated by Dr. Eulah Pont for this. She is also in pain management and they manage her narcotic pain medication.  PAST MEDICAL HISTORY: She is allergic to Motrin. She is on the following medications: Alendronate, Cyclobenzaprine, Enbrel, Hydromorphone, Lyrica, Methotrexate, Naproxen, Oxycodone, Prednisone and Baclofen.  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 She has had the neck fusion, ulnar transposition on the left side.  FAMILY H ISTORY: Positive for arthritis.   SOCIAL HISTORY: She smokes 6-8 cigarettes a day and is advised to quit and the reasons behind this. She does not drink. She is single.  REVIEW OF SYSTEMS: Positive for glasses, cataracts, easy bruising, otherwise negative. Jenna Medina is an 63 y.o. female.   Chief Complaint: Carpal tunnel and cubital tunnel right HPI: see above  Past Medical History  Diagnosis Date  . Arthritis   . Hypertension   . Complication of anesthesia     problems waking up  . GERD (gastroesophageal reflux disease)   . Wears glasses   . Wears partial dentures     Past Surgical History  Procedure Laterality Date  . Wrist arthroplasty      both  . Carpal tunnel release      both hands  . Ankle arthroplasty      both  . Tubal ligation    . Eye surgery  both    cataracts  . Cervical fusion    . Ulnar nerve repair      left  . Shoulder arthroscopy w/ rotator cuff repair      right    History reviewed. No pertinent family history. Social History:  reports that she has been smoking Cigarettes.  She has been smoking about 0.25 packs per day. She does not have any smokeless tobacco history on file. She reports that she does not drink alcohol or use illicit drugs.  Allergies:  Allergies  Allergen Reactions  . Motrin [Ibuprofen] Rash    Skin and mouth  . Latex Rash    No prescriptions prior to admission    Results for orders placed during the hospital encounter of 01/18/14 (from the past 48 hour(s))  POCT I-STAT, CHEM 8     Status: Abnormal   Collection Time    01/17/14  1:38 PM      Result Value Ref Range   Sodium 142  137 - 147 mEq/L   Potassium 3.8  3.7 - 5.3 mEq/L   Chloride 110  96 - 112 mEq/L   BUN 15  6 - 23 mg/dL   Creatinine, Ser 3.87  0.50 - 1.10  mg/dL   Glucose, Bld 89  70 - 99 mg/dL   Calcium, Ion 5.64 (*) 1.13 - 1.30 mmol/L   TCO2 25  0 - 100 mmol/L   Hemoglobin 13.6  12.0 - 15.0 g/dL   HCT 33.2  95.1 - 88.4 %    No results found.   Pertinent items are noted in HPI.  Height 5\' 1"  (1.549 m), weight 50.803 kg (112 lb).  General appearance: alert, cooperative and appears stated age Head: Normocephalic, without obvious abnormality Neck: no JVD Resp: clear to auscultation bilaterally Cardio: regular rate and rhythm, S1, S2 normal, no murmur, click, rub or gallop GI: soft, non-tender; bowel sounds normal; no masses,  no organomegaly Extremities: extremities normal, atraumatic, no cyanosis or edema and numbness right hand Pulses: 2+ and symmetric Skin: Skin color, texture, turgor normal. No rashes or lesions Neurologic: Grossly normal Incision/Wound: na  Assessment/Plan X-rays of her hand reveals the radiocarpal fusion, degenerative changes at the distal radial ulnar joint, Artelon prosthesis of the [facility] joint.   Her elbow shows no degenerative changes.  Diagnosis: (1) Rheumatoid arthritis s/p cervical fusion with cervical spondylosis. (2) Questionable radiculopathy. (3) Carpal tunnel syndrome nerve conductions positive bilaterally. 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   She has seen [facility] and he feels that this is primarily coming from her ulnar nerve at her elbow. He has recommended proceeding with a cubital tunnel release, decompression, possible transposition. We would recommend doing a tenosynovectomy of the flexor tendons at the same time. Pre, peri and post op care are discussed along with risks and complications. Patient is aware there is no guarantee with surgery, possibility of infection, injury to arteries, nerves, and tendons, incomplete relief and dystrophy. She will contact Dr. Wynetta Emery for treatment of her Methotrexate. She is not taking the Enbrel at the present time. This is to her right elbow and  wrist. Rice Walsh R 01/18/2014, 10:24 AM

## 2014-01-18 NOTE — Anesthesia Procedure Notes (Addendum)
Anesthesia Regional Block:  Supraclavicular block  Pre-Anesthetic Checklist: ,, timeout performed, Correct Patient, Correct Site, Correct Laterality, Correct Procedure, Correct Position, site marked, Risks and benefits discussed,  Surgical consent,  Pre-op evaluation,  At surgeon's request and post-op pain management  Laterality: Right and Upper  Prep: chloraprep       Needles:  Injection technique: Single-shot  Needle Type: Echogenic Stimulator Needle     Needle Length: 5cm 5 cm Needle Gauge: 21 and 21 G    Additional Needles:  Procedures: ultrasound guided (picture in chart) Supraclavicular block Narrative:  Start time: 01/18/2014 12:08 PM End time: 01/18/2014 12:12 PM Injection made incrementally with aspirations every 5 mL.  Performed by: Personally  Anesthesiologist: Sheldon Silvan   Performed by: Gar Gibbon    Procedure Name: LMA Insertion Date/Time: 01/18/2014 1:50 PM Performed by: Gar Gibbon Pre-anesthesia Checklist: Patient identified, Emergency Drugs available, Suction available and Patient being monitored Patient Re-evaluated:Patient Re-evaluated prior to inductionOxygen Delivery Method: Circle System Utilized Preoxygenation: Pre-oxygenation with 100% oxygen Intubation Type: IV induction Ventilation: Mask ventilation without difficulty LMA: LMA inserted LMA Size: 4.0 Number of attempts: 1 Airway Equipment and Method: bite block Placement Confirmation: positive ETCO2 Tube secured with: Tape Dental Injury: Teeth and Oropharynx as per pre-operative assessment

## 2014-01-18 NOTE — Progress Notes (Signed)
Assisted Dr. Crews with right, ultrasound guided, supraclavicular block. Side rails up, monitors on throughout procedure. See vital signs in flow sheet. Tolerated Procedure well. 

## 2014-01-18 NOTE — Op Note (Signed)
Dictation Number [id number]

## 2014-01-18 NOTE — Discharge Instructions (Addendum)
[facility] Instructions °Hand Surgery ° °Wound Care: °Keep your hand elevated above the level of your heart.  Do not allow it to dangle by your side.  Keep the dressing dry and do not remove it unless your doctor advises you to do so.  He will usually change it at the time of your post-op visit.  Moving your fingers is advised to stimulate circulation but will depend on the site of your surgery.  If you have a splint applied, your doctor will advise you regarding movement. ° °Activity: °Do not drive or operate machinery today.  Rest today and then you may return to your normal activity and work as indicated by your physician. ° °Diet:  °Drink liquids today or eat a light diet.  You may resume a regular diet tomorrow.   ° °General expectations: °Pain for two to three days. °Fingers may become slightly swollen. ° °Call your doctor if any of the following occur: °Severe pain not relieved by pain medication. °Elevated temperature. °Dressing soaked with blood. °Inability to move fingers. °White or bluish color to fingers. ° ° °Regional Anesthesia Blocks ° °1. Numbness or the inability to move the "blocked" extremity may last from 3-48 hours after placement. The length of time depends on the medication injected and your individual response to the medication. If the numbness is not going away after 48 hours, call your surgeon. ° °2. The extremity that is blocked will need to be protected until the numbness is gone and the  Strength has returned. Because you cannot feel it, you will need to take extra care to avoid injury. Because it may be weak, you may have difficulty moving it or using it. You may not know what position it is in without looking at it while the block is in effect. ° °3. For blocks in the legs and feet, returning to weight bearing and walking needs to be done carefully. You will need to wait until the numbness is entirely gone and the strength has returned. You should be able to move your leg and foot  normally before you try and bear weight or walk. You will need someone to be with you when you first try to ensure you do not fall and possibly risk injury. ° °4. Bruising and tenderness at the needle site are common side effects and will resolve in a few days. ° °5. Persistent numbness or new problems with movement should be communicated to the surgeon or the [facility] ([phone])/ [facility] ([phone]). ° ° °Post Anesthesia Home Care Instructions ° °Activity: °Get plenty of rest for the remainder of the day. A responsible adult should stay with you for 24 hours following the procedure.  °For the next 24 hours, DO NOT: °-Drive a car °-Operate machinery °-Drink alcoholic beverages °-Take any medication unless instructed by your physician °-Make any legal decisions or sign important papers. ° °Meals: °Start with liquid foods such as gelatin or soup. Progress to regular foods as tolerated. Avoid greasy, spicy, heavy foods. If nausea and/or vomiting occur, drink only clear liquids until the nausea and/or vomiting subsides. Call your physician if vomiting continues. ° °Special Instructions/Symptoms: °Your throat may feel dry or sore from the anesthesia or the breathing tube placed in your throat during surgery. If this causes discomfort, gargle with warm salt water. The discomfort should disappear within 24 hours. ° °

## 2014-01-18 NOTE — Anesthesia Postprocedure Evaluation (Signed)
  Anesthesia Post-op Note  Patient: Jenna Medina  Procedure(s) Performed: Procedure(s): DECOMPRESSION/POSSIBLE TRANSPOSITION ULNAR NERVE RIGHT (Right) TENOSYNOVECTOMY FLEXORS RIGHT WRIST (Right)  Patient Location: PACU  Anesthesia Type:GA combined with regional for post-op pain  Level of Consciousness: awake, alert  and oriented  Airway and Oxygen Therapy: Patient Spontanous Breathing  Post-op Pain: none  Post-op Assessment: Post-op Vital signs reviewed  Post-op Vital Signs: Reviewed  Last Vitals:  Filed Vitals:   01/18/14 1530  BP: 165/86  Pulse: 68  Temp:   Resp: 11    Complications: No apparent anesthesia complications

## 2014-01-18 NOTE — Transfer of Care (Signed)
Immediate Anesthesia Transfer of Care Note  Patient: Jenna Medina  Procedure(s) Performed: Procedure(s): DECOMPRESSION/POSSIBLE TRANSPOSITION ULNAR NERVE RIGHT (Right) TENOSYNOVECTOMY FLEXORS RIGHT WRIST (Right)  Patient Location: PACU  Anesthesia Type:GA combined with regional for post-op pain  Level of Consciousness: awake, sedated and patient cooperative  Airway & Oxygen Therapy: Patient Spontanous Breathing and Patient connected to face mask oxygen  Post-op Assessment: Report given to PACU RN and Post -op Vital signs reviewed and stable  Post vital signs: Reviewed and stable  Complications: No apparent anesthesia complications

## 2014-01-18 NOTE — Anesthesia Preprocedure Evaluation (Signed)
Anesthesia Evaluation  Patient identified by MRN, date of birth, ID band Patient awake    Reviewed: Allergy & Precautions, H&P , NPO status , Patient's Chart, lab work & pertinent test results  Airway Mallampati: I TM Distance: >3 FB Neck ROM: Full    Dental  (+) Teeth Intact, Dental Advisory Given   Pulmonary Current Smoker,  breath sounds clear to auscultation        Cardiovascular hypertension, Pt. on medications Rhythm:Regular Rate:Normal     Neuro/Psych    GI/Hepatic GERD-  Medicated and Controlled,  Endo/Other    Renal/GU      Musculoskeletal   Abdominal   Peds  Hematology   Anesthesia Other Findings   Reproductive/Obstetrics                           Anesthesia Physical Anesthesia Plan  ASA: III  Anesthesia Plan: General   Post-op Pain Management:    Induction: Intravenous  Airway Management Planned: LMA  Additional Equipment:   Intra-op Plan:   Post-operative Plan: Extubation in OR  Informed Consent: I have reviewed the patients History and Physical, chart, labs and discussed the procedure including the risks, benefits and alternatives for the proposed anesthesia with the patient or authorized representative who has indicated his/her understanding and acceptance.   Dental advisory given  Plan Discussed with: CRNA, Anesthesiologist and Surgeon  Anesthesia Plan Comments:         Anesthesia Quick Evaluation

## 2014-01-19 NOTE — Op Note (Signed)
NAMEVIHA, Jenna Medina              ACCOUNT NO.:  [id number]  MEDICAL RECORD NO.:  [id number]  LOCATION:                                 FACILITY:  PHYSICIAN:  Cindee Salt, M.D.       DATE OF BIRTH:  November 03, 1950  DATE OF PROCEDURE:  01/18/2014 DATE OF DISCHARGE:                              OPERATIVE REPORT   PREOPERATIVE DIAGNOSES:  Carpal tunnel, cubital tunnel with tenosynovitis, secondary to rheumatoid arthritis of flexor tendons, wrist.  POSTOPERATIVE DIAGNOSES:  Carpal tunnel, cubital tunnel with tenosynovitis, secondary to rheumatoid arthritis of flexor tendons, wrist.  OPERATION:  Decompression ulnar nerve, right elbow with tenosynovectomy of flexor tendons, right wrist.  SURGEON:  Cindee Salt, MD  ANESTHESIA:  Supraclavicular block, general.  ANESTHESIOLOGIST:  Sheldon Silvan, M.D.  HISTORY:  The patient is a 63 year old female with a history of rheumatoid arthritis.  She has undergone multiple procedures in the past including a total joint replacement of her right wrist followed by an attempted fusion using allograft.  The fusion site has not fully healed. She does have some mobility which is not painful for her.  She is complaining of carpal tunnel syndrome and cubital tunnel, with significant swelling of the flexor tendons of her right wrist.  She is admitted now for decompression, possible transposition to the ulnar nerve at the elbow.  A tenosynovectomy, flexor tendons of her right wrist.  She is aware that there is no guarantee with the surgery; possibility of infection; recurrence of injury to arteries, nerves, tendons, incomplete relief of symptoms; dystrophy.  Preoperative area, the patient is seen, the extremity marked by both patient and surgeon. Antibiotic given.  PROCEDURE IN DETAIL:  The patient was brought to the operating room where a supraclavicular block was carried out in the preoperative area by Dr. Ivin Booty.  General anesthetic was then given, she was  prepped in supine position, right arm free.  ChloraPrep was used for prep, 3 minute dry time allowed.  She was then draped.  The limb was exsanguinated with an Esmarch bandage.  Tourniquet placed high on the arm was inflated to 250 mmHg.  A small incision was made just posterior to the medial epicondyle of the right elbow, carried down through subcutaneous tissue. Bleeders were electrocauterized.  Osborne's fascia was incised on its posterior aspect with blunt and sharp dissection, the forearm fascia was separated from flexor carpi ulnaris.  This was then split after placement of a knee retractor.  The muscle belly was split.  A KMI guide for carpal tunnel release was then placed between the ulnar nerve and the deep fascia.  The deep fascia was then released for approximately 8 cm distally with an angled ENT scissors.  The proximal wound was then dressed.  The brachial fascia was separated from the overlying subcutaneous tissue.  The knee retractor placed.  The [facility] guide placed and the brachial fascia was then released posterior to the medial intermuscular septum to the level of the arcade of [place].  The bleeders were again electrocauterized.  The wound was copiously irrigated with saline.  Osborne's fascia was then repaired to the posterior skin flap with 2-0 Vicryl after placing the elbow  in full flexion and seeing no subluxation to the ulnar nerve.  The subcutaneous tissue was closed with interrupted 2-0 Vicryl, and the skin with interrupted 4-0 Vicryl Rapide.  A separate incision was then made on the right wrist volar aspect using the old release.  Carried down through subcutaneous tissue.  A bifid median nerve was then identified.  Care was taken to protect this throughout its course, was found to be densely adherent to the underlying tenosynovial tissue.  This was opened, a large amount of fluid was then obtained along with necrotic-type tissue. Several holes were present in the  volar ligaments to the wrist indicating a probable Charcot joint.  The area was then debrided with a rongeur.  Sharp and blunt dissection.  Specimen was sent to pathology along with cultures to be certain that this was aseptic.  Area was then copiously irrigated with saline.  The subcutaneous tissue closed with interrupted 2-0 Vicryl, and the skin with interrupted 4-0 Vicryl Rapide. A sterile compressive dressing over the entire wrist, forearm, and upper arm was applied.  On deflation of the tourniquet, all fingers immediately pinked.  She was taken to the recovery room for observation in satisfactory condition.  She will be discharged home to return to the [facility] of [place] in 1 week, on Percocet.          ______________________________ Cindee Salt, M.D.     GK/MEDQ  D:  01/18/2014  T:  01/18/2014  Job:  [id number]

## 2014-01-21 LAB — BODY FLUID CULTURE: Culture: NO GROWTH

## 2014-01-22 ENCOUNTER — Encounter (HOSPITAL_BASED_OUTPATIENT_CLINIC_OR_DEPARTMENT_OTHER): Payer: Self-pay | Admitting: Orthopedic Surgery

## 2014-01-23 LAB — ANAEROBIC CULTURE

## 2014-01-24 ENCOUNTER — Encounter (HOSPITAL_BASED_OUTPATIENT_CLINIC_OR_DEPARTMENT_OTHER): Payer: Self-pay | Admitting: Orthopedic Surgery

## 2014-04-10 DIAGNOSIS — M5137 Other intervertebral disc degeneration, lumbosacral region: Secondary | ICD-10-CM | POA: Diagnosis not present

## 2014-04-17 DIAGNOSIS — M5136 Other intervertebral disc degeneration, lumbar region: Secondary | ICD-10-CM | POA: Diagnosis not present

## 2014-04-17 DIAGNOSIS — M150 Primary generalized (osteo)arthritis: Secondary | ICD-10-CM | POA: Diagnosis not present

## 2014-04-17 DIAGNOSIS — M0589 Other rheumatoid arthritis with rheumatoid factor of multiple sites: Secondary | ICD-10-CM | POA: Diagnosis not present

## 2014-04-17 DIAGNOSIS — M810 Age-related osteoporosis without current pathological fracture: Secondary | ICD-10-CM | POA: Diagnosis not present

## 2014-04-30 DIAGNOSIS — M1990 Unspecified osteoarthritis, unspecified site: Secondary | ICD-10-CM | POA: Diagnosis not present

## 2014-04-30 DIAGNOSIS — Z79891 Long term (current) use of opiate analgesic: Secondary | ICD-10-CM | POA: Diagnosis not present

## 2014-04-30 DIAGNOSIS — M069 Rheumatoid arthritis, unspecified: Secondary | ICD-10-CM | POA: Diagnosis not present

## 2014-04-30 DIAGNOSIS — G894 Chronic pain syndrome: Secondary | ICD-10-CM | POA: Diagnosis not present

## 2014-04-30 DIAGNOSIS — Z79899 Other long term (current) drug therapy: Secondary | ICD-10-CM | POA: Diagnosis not present

## 2014-06-18 DIAGNOSIS — M5136 Other intervertebral disc degeneration, lumbar region: Secondary | ICD-10-CM | POA: Diagnosis not present

## 2014-06-18 DIAGNOSIS — M150 Primary generalized (osteo)arthritis: Secondary | ICD-10-CM | POA: Diagnosis not present

## 2014-06-18 DIAGNOSIS — M810 Age-related osteoporosis without current pathological fracture: Secondary | ICD-10-CM | POA: Diagnosis not present

## 2014-06-18 DIAGNOSIS — M0589 Other rheumatoid arthritis with rheumatoid factor of multiple sites: Secondary | ICD-10-CM | POA: Diagnosis not present

## 2014-06-25 DIAGNOSIS — M1990 Unspecified osteoarthritis, unspecified site: Secondary | ICD-10-CM | POA: Diagnosis not present

## 2014-06-25 DIAGNOSIS — M069 Rheumatoid arthritis, unspecified: Secondary | ICD-10-CM | POA: Diagnosis not present

## 2014-06-25 DIAGNOSIS — Z79899 Other long term (current) drug therapy: Secondary | ICD-10-CM | POA: Diagnosis not present

## 2014-06-25 DIAGNOSIS — G894 Chronic pain syndrome: Secondary | ICD-10-CM | POA: Diagnosis not present

## 2014-07-03 DIAGNOSIS — M79602 Pain in left arm: Secondary | ICD-10-CM | POA: Diagnosis not present

## 2014-07-03 DIAGNOSIS — Z8781 Personal history of (healed) traumatic fracture: Secondary | ICD-10-CM | POA: Diagnosis not present

## 2014-08-08 DIAGNOSIS — S42352A Displaced comminuted fracture of shaft of humerus, left arm, initial encounter for closed fracture: Secondary | ICD-10-CM | POA: Diagnosis not present

## 2014-08-08 DIAGNOSIS — S42392A Other fracture of shaft of left humerus, initial encounter for closed fracture: Secondary | ICD-10-CM | POA: Diagnosis not present

## 2014-08-20 DIAGNOSIS — M069 Rheumatoid arthritis, unspecified: Secondary | ICD-10-CM | POA: Diagnosis not present

## 2014-08-20 DIAGNOSIS — Z79899 Other long term (current) drug therapy: Secondary | ICD-10-CM | POA: Diagnosis not present

## 2014-08-20 DIAGNOSIS — G894 Chronic pain syndrome: Secondary | ICD-10-CM | POA: Diagnosis not present

## 2014-08-20 DIAGNOSIS — M1990 Unspecified osteoarthritis, unspecified site: Secondary | ICD-10-CM | POA: Diagnosis not present

## 2014-09-05 DIAGNOSIS — I10 Essential (primary) hypertension: Secondary | ICD-10-CM | POA: Diagnosis not present

## 2014-09-05 DIAGNOSIS — S42352D Displaced comminuted fracture of shaft of humerus, left arm, subsequent encounter for fracture with routine healing: Secondary | ICD-10-CM | POA: Diagnosis not present

## 2014-09-05 DIAGNOSIS — Z720 Tobacco use: Secondary | ICD-10-CM | POA: Diagnosis not present

## 2014-09-05 DIAGNOSIS — Z0000 Encounter for general adult medical examination without abnormal findings: Secondary | ICD-10-CM | POA: Diagnosis not present

## 2014-09-05 DIAGNOSIS — E559 Vitamin D deficiency, unspecified: Secondary | ICD-10-CM | POA: Diagnosis not present

## 2014-09-05 DIAGNOSIS — M069 Rheumatoid arthritis, unspecified: Secondary | ICD-10-CM | POA: Diagnosis not present

## 2014-09-14 ENCOUNTER — Encounter: Payer: Self-pay | Admitting: Internal Medicine

## 2014-09-18 DIAGNOSIS — M5136 Other intervertebral disc degeneration, lumbar region: Secondary | ICD-10-CM | POA: Diagnosis not present

## 2014-09-18 DIAGNOSIS — M150 Primary generalized (osteo)arthritis: Secondary | ICD-10-CM | POA: Diagnosis not present

## 2014-09-18 DIAGNOSIS — M0589 Other rheumatoid arthritis with rheumatoid factor of multiple sites: Secondary | ICD-10-CM | POA: Diagnosis not present

## 2014-09-18 DIAGNOSIS — M810 Age-related osteoporosis without current pathological fracture: Secondary | ICD-10-CM | POA: Diagnosis not present

## 2014-10-03 DIAGNOSIS — S42202A Unspecified fracture of upper end of left humerus, initial encounter for closed fracture: Secondary | ICD-10-CM | POA: Diagnosis not present

## 2014-10-03 DIAGNOSIS — S42352D Displaced comminuted fracture of shaft of humerus, left arm, subsequent encounter for fracture with routine healing: Secondary | ICD-10-CM | POA: Diagnosis not present

## 2014-10-03 DIAGNOSIS — S6422XA Injury of radial nerve at wrist and hand level of left arm, initial encounter: Secondary | ICD-10-CM | POA: Diagnosis not present

## 2014-10-03 DIAGNOSIS — M62542 Muscle wasting and atrophy, not elsewhere classified, left hand: Secondary | ICD-10-CM | POA: Diagnosis not present

## 2014-10-03 DIAGNOSIS — M25442 Effusion, left hand: Secondary | ICD-10-CM | POA: Diagnosis not present

## 2014-11-08 ENCOUNTER — Encounter: Payer: Self-pay | Admitting: *Deleted

## 2014-11-19 ENCOUNTER — Ambulatory Visit (INDEPENDENT_AMBULATORY_CARE_PROVIDER_SITE_OTHER): Payer: Medicare Other | Admitting: Internal Medicine

## 2014-11-19 ENCOUNTER — Encounter: Payer: Self-pay | Admitting: Internal Medicine

## 2014-11-19 VITALS — BP 90/64 | HR 72 | Ht 61.0 in | Wt 110.8 lb

## 2014-11-19 DIAGNOSIS — R195 Other fecal abnormalities: Secondary | ICD-10-CM

## 2014-11-19 DIAGNOSIS — Z800 Family history of malignant neoplasm of digestive organs: Secondary | ICD-10-CM | POA: Diagnosis not present

## 2014-11-19 MED ORDER — NA SULFATE-K SULFATE-MG SULF 17.5-3.13-1.6 GM/177ML PO SOLN: ORAL | Status: AC

## 2014-11-19 NOTE — Progress Notes (Signed)
Patient ID: Jenna Medina, female   DOB: June 22, 1950, 64 y.o.   MRN: [id number] HPI: Jenna Medina is a 64 year old female with past medical history of hypertension, rheumatoid arthritis, vitamin D deficiency, GERD who is seen in consultation at the request of Dr. Marlou Sa for recent heme positive stool. She is here alone today. She reports overall she's been feeling well recently. She's been troubled by her left shoulder which was fractured about 18 months ago after a fall. She denies specific GI complaint today and reports after her recent physical by primary care she returned heme occult card and was notified this was positive. She has never had prior colonoscopy. She reports her father had colon cancer diagnosed around age 18. She denies abdominal pain. Denies seeing visible blood in her stool or melena. Reports bowel movements are regular without diarrhea or constipation. She has a history of GERD but reports this has not been a problem of late. She does take Nexium 40 mg daily. She denies history of dysphagia or odynophagia. Reports appetite is good and denies nausea and vomiting. Denies hepatobiliary complaint.  Past Medical History  Diagnosis Date  . Arthritis   . Hypertension   . Complication of anesthesia     problems waking up  . GERD (gastroesophageal reflux disease)   . Wears glasses   . Wears partial dentures   . Rheumatoid arthritis   . Vitamin D deficiency     Past Surgical History  Procedure Laterality Date  . Wrist arthroplasty      both  . Carpal tunnel release      both hands  . Ankle arthroplasty      both  . Tubal ligation    . Eye surgery  both    cataracts  . Cervical fusion    . Ulnar nerve repair      left  . Shoulder arthroscopy w/ rotator cuff repair      right  . Ulnar nerve transposition Right 01/18/2014    Procedure: DECOMPRESSION ULNAR NERVE RIGHT;  Surgeon: Daryll Brod, MD;  Location: [facility];  Service: Orthopedics;  Laterality: Right;  .  Tenosynovectomy Right 01/18/2014    Procedure: TENOSYNOVECTOMY FLEXORS RIGHT WRIST;  Surgeon: Daryll Brod, MD;  Location: [facility];  Service: Orthopedics;  Laterality: Right;    Outpatient Prescriptions Prior to Visit  Medication Sig Dispense Refill  . alendronate (FOSAMAX) 70 MG tablet Take 70 mg by mouth once a week. Sunday.   Take with a full glass of water on an empty stomach.    Marland Kitchen aspirin EC 81 MG tablet Take 81 mg by mouth daily.    . B Complex-C (B-COMPLEX WITH VITAMIN C) tablet Take 1 tablet by mouth daily.    . baclofen (LIORESAL) 10 MG tablet Take 10 mg by mouth 3 (three) times daily as needed for muscle spasms.     . calcium carbonate (OS-CAL) 600 MG TABS tablet Take 600 mg by mouth daily with breakfast.    . cephALEXin (KEFLEX) 250 MG capsule Take 1 capsule (250 mg total) by mouth 3 (three) times daily. 30 capsule 0  . cholecalciferol (VITAMIN D) 400 UNITS TABS tablet Take 400 Units by mouth daily.    . cyclobenzaprine (FLEXERIL) 10 MG tablet Take 10 mg by mouth 3 (three) times daily as needed for muscle spasms.    Scarlette Shorts SURECLICK 50 MG/ML injection Inject 50 mg as directed once a week. wednesday    . esomeprazole (NEXIUM) 40 MG  capsule Take 40 mg by mouth daily at 12 noon.    . ferrous sulfate 325 (65 FE) MG tablet Take 325 mg by mouth daily with breakfast.    . folic acid (FOLVITE) 1 MG tablet Take 1 mg by mouth daily.    Marland Kitchen HYDROcodone-acetaminophen (NORCO) 5-325 MG per tablet Take 1 tablet by mouth every 4 (four) hours as needed. 6 tablet 0  . HYDROmorphone HCl (EXALGO) 12 MG T24A SR tablet Take 12 mg by mouth daily.    Marland Kitchen lisinopril (PRINIVIL,ZESTRIL) 30 MG tablet Take 30 mg by mouth daily.     Marland Kitchen LYRICA 50 MG capsule Take 50 mg by mouth 3 (three) times daily.     . methotrexate (RHEUMATREX) 2.5 MG tablet Take 20 mg by mouth once a week. friday    . Multiple Vitamin (MULTIVITAMIN WITH MINERALS) TABS tablet Take 1 tablet by mouth daily.    . naproxen (NAPROSYN)  500 MG tablet Take 500 mg by mouth 2 (two) times daily with a meal.    . oxyCODONE-acetaminophen (PERCOCET) 10-325 MG per tablet Take 1 tablet by mouth every 4 (four) hours as needed for pain.    Marland Kitchen oxyCODONE-acetaminophen (PERCOCET) 10-325 MG per tablet Take 1 tablet by mouth every 4 (four) hours as needed for pain. 30 tablet 0  . predniSONE (DELTASONE) 1 MG tablet Take 5 mg by mouth daily with breakfast. Take with $RemoveB'5mg'GktAUSjR$  tablet For a total of $Remove'10mg'aYroQPH$     . predniSONE (DELTASONE) 5 MG tablet Take 5 mg by mouth daily with breakfast. Take with #5 - $Re'1mg'IzM$  tablets for a total of $Remove'10mg'hrKbPDr$      No facility-administered medications prior to visit.    Allergies  Allergen Reactions  . Motrin [Ibuprofen] Rash    Skin and mouth  . Latex Rash    Family History  Problem Relation Age of Onset  . Colon cancer Father     Social History  Substance Use Topics  . Smoking status: Current Some Day Smoker -- 0.25 packs/day    Types: Cigarettes  . Smokeless tobacco: Never Used  . Alcohol Use: No    ROS: As per history of present illness, otherwise negative  BP 90/64 mmHg  Pulse 72  Ht $R'5\' 1"'rJ$  (1.549 m)  Wt 110 lb 12.8 oz (50.259 kg)  BMI 20.95 kg/m2 Constitutional: Well-developed and well-nourished. No distress. HEENT: Normocephalic and atraumatic. Oropharynx is clear and moist. No oropharyngeal exudate. Conjunctivae are normal.  No scleral icterus. Neck: Neck supple. Trachea midline. Cardiovascular: Normal rate, regular rhythm and intact distal pulses.  Pulmonary/chest: Effort normal and breath sounds normal. No wheezing, rales or rhonchi. Abdominal: Soft, nontender, nondistended. Bowel sounds active throughout.  Extremities: no clubbing, or cyanosis, bilateral lower extremity edema 1+, changes of arthritis bilateral hands Lymphadenopathy: No cervical adenopathy noted. Neurological: Alert and oriented to person place and time. Skin: Skin is warm and dry. No rashes noted. Psychiatric: Normal mood and affect.  Behavior is normal.  RELEVANT LABS AND IMAGING: CBC    Component Value Date/Time   WBC 16.7* 11/22/2008 2103   RBC 5.34* 11/22/2008 2103   HGB 12.4 01/18/2014 1157   HCT 40.0 01/17/2014 1338   PLT 404* 11/22/2008 2103   MCV 78.3 11/22/2008 2103   MCHC 30.9 11/22/2008 2103   RDW 15.9* 11/22/2008 2103   LYMPHSABS 4.4* 11/22/2008 2103   MONOABS 1.0 11/22/2008 2103   EOSABS 0.2 11/22/2008 2103   BASOSABS 0.1 11/22/2008 2103    CMP     Component  Value Date/Time   NA 142 01/17/2014 1338   K 3.8 01/17/2014 1338   CL 110 01/17/2014 1338   CO2 24 10/23/2009 1957   GLUCOSE 89 01/17/2014 1338   BUN 15 01/17/2014 1338   CREATININE 0.80 01/17/2014 1338   CALCIUM 9.1 10/23/2009 1957   PROT 6.6 03/26/2009 1914   ALBUMIN 4.1 03/26/2009 1914   AST 20 03/26/2009 1914   ALT 13 03/26/2009 1914   ALKPHOS 51 03/26/2009 1914   BILITOT 0.5 03/26/2009 1914   GFRNONAA >60 01/16/2008 1032   GFRAA  01/16/2008 1032    >60        The eGFR has been calculated using the MDRD equation. This calculation has not been validated in all clinical   Outside labs reviewed dated 09/05/2014 -- hemoglobin slightly low at 11.7, MCV normal at 85.8  ASSESSMENT/PLAN: 64 year old female with past medical history of hypertension, rheumatoid arthritis, vitamin D deficiency, GERD who is seen in consultation at the request of Dr. Marlou Sa for recent heme positive stool.   1. Heme positive stool/family history of colon cancer -- colonoscopy recommended to evaluate heme positive stool and for screening. We discussed the tests including the risks, benefits and alternatives and she is agreeable to proceed  2. GERD -- history of denies symptoms recently. She is taking PPI 40 mg daily as prescribed by primary care. There are no current alarm symptoms.   Cc:Dr. Kevan Ny, MD [facility]

## 2014-11-19 NOTE — Patient Instructions (Signed)

## 2014-12-06 DEATH — deceased

## 2015-01-14 ENCOUNTER — Telehealth: Payer: Self-pay | Admitting: Internal Medicine

## 2015-01-14 NOTE — Telephone Encounter (Signed)
Dr. Pyrtle- FYI 

## 2015-01-14 NOTE — Telephone Encounter (Signed)
Per daughter pt passed on 12-03-14.

## 2015-01-15 ENCOUNTER — Encounter: Payer: Medicaid Other | Admitting: Internal Medicine
